# Patient Record
Sex: Male | Born: 1955 | Race: White | Hispanic: No | Marital: Single | Smoking: Never smoker
Health system: Southern US, Community
[De-identification: ages and names within clinical notes are randomized; demographics above are authoritative.]

## PROBLEM LIST (undated history)

## (undated) DIAGNOSIS — R6250 Unspecified lack of expected normal physiological development in childhood: Secondary | ICD-10-CM

## (undated) DIAGNOSIS — F419 Anxiety disorder, unspecified: Secondary | ICD-10-CM

## (undated) DIAGNOSIS — C73 Malignant neoplasm of thyroid gland: Secondary | ICD-10-CM

## (undated) HISTORY — DX: Anxiety disorder, unspecified: F41.9

## (undated) HISTORY — PX: OTHER SURGICAL HISTORY: SHX169

## (undated) HISTORY — DX: Malignant neoplasm of thyroid gland: C73

## (undated) HISTORY — PX: THYROIDECTOMY: SHX17

---
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
## 2010-03-30 ENCOUNTER — Ambulatory Visit: Payer: Self-pay | Admitting: Gastroenterology

## 2014-02-01 ENCOUNTER — Ambulatory Visit: Payer: Self-pay | Admitting: Otolaryngology

## 2014-02-08 DIAGNOSIS — C73 Malignant neoplasm of thyroid gland: Secondary | ICD-10-CM

## 2014-02-08 HISTORY — DX: Malignant neoplasm of thyroid gland: C73

## 2014-02-13 ENCOUNTER — Ambulatory Visit: Payer: Self-pay | Admitting: Otolaryngology

## 2014-03-18 ENCOUNTER — Ambulatory Visit: Payer: Self-pay | Admitting: Otolaryngology

## 2014-03-28 ENCOUNTER — Ambulatory Visit: Payer: Self-pay | Admitting: Otolaryngology

## 2014-03-28 LAB — ALBUMIN: Albumin: 3.6 g/dL (ref 3.4–5.0)

## 2014-03-28 LAB — CALCIUM
Calcium, Total: 8.5 mg/dL (ref 8.5–10.1)
Calcium, Total: 8.5 mg/dL (ref 8.5–10.1)

## 2014-03-28 LAB — MAGNESIUM: MAGNESIUM: 2.1 mg/dL

## 2014-03-29 LAB — CALCIUM: Calcium, Total: 8.7 mg/dL (ref 8.5–10.1)

## 2014-04-01 LAB — PATHOLOGY REPORT

## 2014-10-07 DIAGNOSIS — Z85850 Personal history of malignant neoplasm of thyroid: Secondary | ICD-10-CM | POA: Insufficient documentation

## 2015-02-01 NOTE — Op Note (Signed)
PATIENT NAME:  Ivan Winters, Ivan Winters MR#:  [id number] DATE OF BIRTH:  03-17-1956  DATE OF PROCEDURE:  03/28/2014  PREOPERATIVE DIAGNOSIS: Right-sided thyroid nodule with suspicious pathology on fine-needle aspiration.   POSTOPERATIVE DIAGNOSIS: Right-sided thyroid nodule with suspicious pathology on fine-needle aspiration.   PROCEDURE PERFORMED: Minimally invasive total thyroidectomy with laryngeal nerve monitoring.   ANESTHESIA: General endotracheal anesthesia.   ESTIMATED BLOOD LOSS: Less than 5 mL.   IV FLUIDS: Please see anesthesia record.   COMPLICATIONS: None.   DRAINS/STENT PLACEMENTS: None.   SPECIMENS: Total thyroid with a marking stitch in the right superior lobe.   SURGEON: Carloyn Manner, MD  ASSISTANT: Beverly Gust, MD   INDICATIONS FOR PROCEDURE: The patient is a 59 year old gentleman with history of neck pain, found to have fullness in the area of his right thyroid. 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   OPERATIVE FINDINGS: Total thyroidectomy performed. Bilateral recurrent laryngeal nerves were identified and robustly stimulated throughout the duration of the case. Bilateral superior and inferior parathyroid glands were identified and preserved.   DESCRIPTION OF PROCEDURE: After the patient was identified in holding, benefits and risks of the procedure were discussed and consent was reviewed, the patient was taken to the operating room and placed in the supine position. General endotracheal anesthesia with laryngeal nerve monitoring electrode on the endotracheal tube was placed. Visualization of proper placement was made by myself. At this time, the patient was prepped and draped in sterile fashion after injection of 7 mL of 0.25% Marcaine with 1:200,000 epinephrine. A horizontal skin incision was made between the strap muscles along the previously marked skin crease. Dissection was carried down through to the level  of strap muscles using Bovie electrocautery. Strap muscles were divided vertically along the median raphe from the thyroid notch down to the sternal notch, and attention was directed to patient's right hemi-thyroid.   The sternohyoid and sternothyroid muscles were bluntly and sharply dissected away from the right hemi-thyroid laterally to the carotid artery and superior to the superior thyroid vessels. A cleft and Joel's space between the larynx and the superior thyroid gland was bluntly dissected. This pedicled the superior pole and this was ligated using a Harmonic scalpel. The thyroid, at this point, was delivered out of the wound. This pedicled Berry's ligament. Blunt dissection in an area of the recurrent laryngeal nerve revealed superior parathyroid gland just anterior to the nerve. This was bluntly dissected and protected. The recurrent laryngeal nerve was identified and robustly stimulated. The inferior thyroid artery, at this time, was ligated with a Harmonic scalpel. The inferior parathyroid gland was identified and bluntly as well as sharply with a microbipolar forceps, dissected away from the inferior thyroid lobe. The remaining attachments of the right hemi-thyroid to the trachea were dissected using a Harmonic scalpel.   Attention, at this time, was directed to the patient's left hemi-thyroid. In a similar fashion, the sternothyroid and sternohyoid muscles were bluntly and sharply dissected away from the left hemi-thyroid until the carotid artery was encountered. Dissection was superior until the superior thyroid vessels. Joel's space was entered between the larynx and the left superior thyroid lobe. The left superior thyroid vessels were pedicled. A hemostat was placed along the superior thyroid lobe and then the superior thyroid vessels were ligated using a Harmonic scalpel. The thyroid, at this time, was retracted medially and dissection in the tracheoesophageal groove was made on the left side.  This demonstrated superior parathyroid gland and just  adjacent to that the recurrent laryngeal nerve. Dissection was more inferiorly and again fatty area of parathyroid gland in it was identified and this was protected. The recurrent laryngeal nerve was traced up until its insertion into the patient's larynx as well as inferiorly and then Berry's ligament was ligated using a combination of bipolar and Harmonic scalpel. Remaining attachments of the total thyroid were separated from the anterior tracheal wall. The specimen was marked in the right superior pole. The wound was copiously irrigated with sterile saline. Meticulous hemostasis was ensured. Surgicel was placed along the thyroid wound bed against the recurrent laryngeal nerve after they were robustly stimulated at the completion of the case and then the strap muscles were closed with a single figure-of-eight stitch of Vicryl and then the skin was closed in an interrupted fashion with Vicryl subcutaneous and Dermabond cutaneously and this was topped with a Steri-Strip. Care of the patient, at this time, was transferred to anesthesia.   ____________________________ Jerene Bears, MD ccv:aw D: 03/28/2014 08:54:26 ET T: 03/28/2014 09:08:30 ET JOB#: [id number]  cc: Jerene Bears, MD, <Dictator> Jerene Bears MD ELECTRONICALLY SIGNED 04/03/2014 8:27

## 2015-12-12 ENCOUNTER — Ambulatory Visit: Payer: Self-pay | Admitting: Physician Assistant

## 2015-12-12 ENCOUNTER — Encounter: Payer: Self-pay | Admitting: Physician Assistant

## 2015-12-12 VITALS — BP 119/65 | HR 91 | Temp 97.6°F

## 2015-12-12 DIAGNOSIS — J0100 Acute maxillary sinusitis, unspecified: Secondary | ICD-10-CM

## 2015-12-12 NOTE — Progress Notes (Signed)
   Subjective:    Patient ID: Ivan Winters, male    DOB: 08-24-1956, 60 y.o.   MRN: [id number]  HPI Patient c/o sinus and ear pressure for more than one week. Intermitting fever. Post nasal drainage. Mild cough. Denies N/V/D No palliative measure for compliant.   Review of Systems    Hyperthyroidism Objective:   Physical Exam HEENT for bilateral maxillary sinus guarding, edematous bilateral nasal turbinates.  Thic nasal discharge. Post nasal drainage.  Neck supple without adenopathy. Lungs CTA, and Heart RRR.       Assessment & Plan: Maxillary sinusitis   Amoxil and Allergra -D.  Follow up PCP.

## 2016-01-19 ENCOUNTER — Ambulatory Visit: Payer: Self-pay | Admitting: Physician Assistant

## 2016-01-19 ENCOUNTER — Encounter: Payer: Self-pay | Admitting: Physician Assistant

## 2016-01-19 VITALS — BP 110/69 | HR 85 | Temp 98.0°F

## 2016-01-19 DIAGNOSIS — J029 Acute pharyngitis, unspecified: Secondary | ICD-10-CM

## 2016-01-19 MED ORDER — AZITHROMYCIN 250 MG PO TABS: ORAL_TABLET | ORAL | Status: AC

## 2016-01-19 NOTE — Progress Notes (Signed)
S: C/o runny nose and congestion for 3 days, sore throat for 5d ;  no fever, chills, cp/sob, v/d; mucus is green and thick, cough is sporadic, c/o of facial and dental pain.   Using otc meds:   O: PE: vitals wnl, nad, perrl eomi, normocephalic, tms dull, nasal mucosa red and swollen, throat red, neck supple no lymph, lungs c t a, cv rrr, neuro intact  A:  Acute pharyngitis    P: zpack, drink fluids, continue regular meds , use otc meds of choice, return if not improving in 5 days, return earlier if worsening

## 2016-03-12 ENCOUNTER — Ambulatory Visit: Payer: Self-pay | Admitting: Physician Assistant

## 2016-03-12 ENCOUNTER — Encounter: Payer: Self-pay | Admitting: Physician Assistant

## 2016-03-12 VITALS — BP 120/80 | HR 76 | Temp 98.2°F

## 2016-03-12 DIAGNOSIS — H6123 Impacted cerumen, bilateral: Secondary | ICD-10-CM

## 2016-03-12 DIAGNOSIS — H6990 Unspecified Eustachian tube disorder, unspecified ear: Secondary | ICD-10-CM

## 2016-03-12 DIAGNOSIS — H60393 Other infective otitis externa, bilateral: Secondary | ICD-10-CM

## 2016-03-12 DIAGNOSIS — H6980 Other specified disorders of Eustachian tube, unspecified ear: Secondary | ICD-10-CM

## 2016-03-12 MED ORDER — FEXOFENADINE HCL 180 MG PO TABS: 180.0000 mg | ORAL_TABLET | Freq: Every day | ORAL | Status: DC

## 2016-03-12 MED ORDER — NEOMYCIN-POLYMYXIN-HC 3.5-10000-1 OT SOLN: 3.0000 [drp] | Freq: Three times a day (TID) | OTIC | Status: DC

## 2016-03-12 MED ORDER — PREDNISONE 10 MG PO TABS: 30.0000 mg | ORAL_TABLET | Freq: Every day | ORAL | Status: DC

## 2016-03-12 NOTE — Progress Notes (Signed)
S:  C/o ears popping and being stopped up, no drainage from ears, no fever/chills, no cough or congestion, some sore throat, remainder ros neg Using otc meds without relief  O:  Vitals wnl, nad,  A lot of cerumen in ear canals, tms dull white b/l,  throat wnl, neck supple no lymph, lungs c t a, cv rrr, neuro intact, irrigated ears b/l, cerumen removed, tms intact  A: acute eustachean tube dysfunction, cerumen removal  P: allegra, prednisone 30mg  qd x 3d, cortisporin otic drops, return if not improving in 3 to 5 days, return earlier if worsening

## 2016-05-12 DIAGNOSIS — F411 Generalized anxiety disorder: Secondary | ICD-10-CM | POA: Insufficient documentation

## 2016-05-12 DIAGNOSIS — H8110 Benign paroxysmal vertigo, unspecified ear: Secondary | ICD-10-CM | POA: Insufficient documentation

## 2016-06-04 ENCOUNTER — Ambulatory Visit: Payer: BLUE CROSS/BLUE SHIELD | Attending: Neurology | Admitting: Physical Therapy

## 2016-06-11 ENCOUNTER — Ambulatory Visit: Payer: BLUE CROSS/BLUE SHIELD | Admitting: Physical Therapy

## 2016-06-18 ENCOUNTER — Ambulatory Visit: Payer: BLUE CROSS/BLUE SHIELD | Attending: Neurology | Admitting: Physical Therapy

## 2016-06-18 DIAGNOSIS — R42 Dizziness and giddiness: Secondary | ICD-10-CM | POA: Diagnosis not present

## 2016-06-18 NOTE — Therapy (Signed)
[place] MAIN [facility] SERVICES [address] [place], [place], [address] Phone: [phone]   Fax:  [phone]  Physical Therapy Evaluation  Patient Details  Name: Ivan Winters MRN: [id number] Date of Birth: 05/30/1956 Referring Provider: Dr. Melrose Nakayama  Encounter Date: 06/18/2016      PT End of Session - 06/21/16 1539    Visit Number 1   Number of Visits 9   Date for PT Re-Evaluation 08/13/16   PT Start Time [id number]   PT Stop Time 1500   PT Time Calculation (min) 65 min   Equipment Utilized During Treatment Gait belt   Activity Tolerance Patient tolerated treatment well   Behavior During Therapy [facility] for tasks assessed/performed      Past Medical History:  Diagnosis Date  . Anxiety   . Malignant neoplasm of thyroid gland ([place]) 02/2014   Per neurology MD MR notes    Past Surgical History:  Procedure Laterality Date  . [place]   per neurology MD MR notes  . pins wrist-left Left   . THYROIDECTOMY      There were no vitals filed for this visit.       Subjective Assessment - 06/21/16 1615    Subjective Patient states that some of the tests are making him dizzy during the evlauation and reports vertigo with Dix-Hallpike testing this date.    Patient is accompained by: Family member  sister, Ivan Winters   Pertinent History There is no mention in medical record of learning difficulties however patient is unable to read and part of history provided by his sister, Ivan Winters as patient has difficulty expressing himself at times. In addition, patient has difficulty with numerical rating systems and patient's sister reports that patient has a difficult time with numbers. Patient reports he started having dizziness about 2 years ago. Patient reports vertigo symptoms and states he gets vertigo every day when he goes to lie down at night. Pt's sister Ivan Winters reports that patient had VNG testing performed at ENT physician office, but she is  not sure of test results.  Description of dizziness: vertigo, unsteadiness, lightheadedness, falling, general unsteadiness, aural fullness, nausea. Pt reports he gets headaches sometimes when he wakes up and reports he has usually 1-2 headaches a week. Frequency: every night when he goes to lie down and when he rolls over in bed. Duration: lasts about 1 minute; Symptoms nature: motion provoked/positional, intermittent; Provocatibe Factors: lying down, bending over and rolling over in bed, quick turns; Easing factors: staying still; History of similar episodes: none.     Diagnostic tests VNG tests per sister report, unknown results   Patient Stated Goals to have decreased dizziness and vertigo symptoms especially when he goes to lie down at night.    Currently in Pain? No/denies     VESTIBULAR AND BALANCE EVALUATION  Onset Date: 2 years ago  HISTORY:  Subjective history of current problem: There is no mention in medical record of learning difficulties however patient is unable to read and part of history provided by his sister, Ivan Winters as patient has difficulty expressing himself at times. In addition, patient has difficulty with numerical rating systems and patient's sister reports that patient has a difficult time with numbers. Patient reports he started having dizziness about 2 years ago. Patient reports vertigo symptoms and states he gets vertigo every day when he goes to lie down at night. Pt's sister Ivan Winters reports that patient had VNG testing performed at ENT physician  office, but she is not sure of test results.  Description of dizziness: vertigo, unsteadiness, lightheadedness, falling, general unsteadiness, aural fullness, nausea. Pt reports he gets headaches sometimes when he wakes up and reports he has usually 1-2 headaches a week.  Frequency: every night when he goes to lie down and when he rolls over in bed  Duration: lasts about a minute Symptom nature: motion provoked/positional,  intermittent  Provocative Factors: lying down, bending over and rolling over bed, quick turns Easing Factors: staying still   History of similar episodes: none  Falls (yes/no): no Number of falls in past 6 months: none  Prior Functional Level: Pt works in Nurse, adult work currently. Patient walks around town for recreation and exercise.   Auditory complaints (tinnitus, pain, drainage): pt reports ringing in left ear for about 1 year. Denies pain and drainage Vision (last eye exam, diplopia, recent changes): "crystal lights flashing" in his eyes which he reports has been going on for years. No recent ages.   Current Symptoms: Review of systems negative for red flags.   EXAMINATION  POSTURE:  Forward head, rounded shoulders; slumped seated posture with sacral sitting       COORDINATION: Finger to Nose:   Normal Past Pointing:   Normal   MUSCULOSKELETAL SCREEN: Cervical Spine ROM: Forward head posture, pt with cervical AROM R/L rotation WFL and normal flexion/extension. Pt reports mild discomfort along front sides of his neck along the platsyma muscle with neck movements.   Functional Mobility: Independent with bed mobility and transfers  Gait: Patient ambulates without AD. Pt with step through gait pattern and able to ambulate up/down stairs using bilateral rails. Pt does demonstrate change in gait speed with attempts to step over an object placed on floor. Scanning of visual environment with gait is: fair  Balance: Pt demonstrates mild imbalance with EC on foam with feet together. Pt demonstrates mild difficulty with SLS activites. Pt does well with ambulation with heaad turns and body turns this date.   POSTURAL CONTROL TESTS:  Clinical Test of Sensory Interaction for Balance    (CTSIB):  CONDITION TIME STRATEGY SWAY  Eyes open, firm surface 30 seconds ankle   Eyes closed, firm surface 30 seconds ankle +1  Eyes open, foam surface 30 seconds ankle +1  Eyes closed, foam  surface 30 seconds ankle +2    OCULOMOTOR / VESTIBULAR TESTING:  Oculomotor Exam- Room Light  Normal Abnormal Comments  Ocular Alignment N    Ocular ROM N    Spontaneous Nystagmus N    End-Gaze Nystagmus  A Horizontal nystagmus at end gaze with left gaze  Smooth Pursuit  A Large saccades noted   Saccades N  accurate  VOR  A Recreates dizziness and blurring  VOR Cancellation  A Causes dizziness  Left Head Thrust   Deferred secondary to positive Dix-Hallpike test this date  Right Head Thrust   Deferred secondary to positive Dix-Hallpike test this date  Head Shaking Nystagmus   Deferred secondary to positive Dix-Hallpike test this date     BPPV TESTS:  Symptoms Duration Intensity Nystagmus  L Dix-Hallpike vertigo Less than one minute Unable to rate on 0-10 scale; pt reports left symptoms worse than right symptoms None observed in room light and with Frenzl lenses donned  R Dix-Hallpike vertigo Less than one minute Unable to rate on 0-10 scale None observed    FUNCTIONAL OUTCOME MEASURES:  Results Comments  DHI 28 Low perception of handicap  ABC Scale 66.2% Falls risk; in  need of intervention  DGI 22/24 Safe for community mobility           [facility] PT Assessment - 06/21/16 0001      Assessment   Medical Diagnosis bilateral BPPV   Referring Provider Dr. Melrose Nakayama   Onset Date/Surgical Date --  2 years ago   Prior Therapy none     Precautions   Precautions None     Restrictions   Weight Bearing Restrictions No     Balance Screen   Has the patient fallen in the past 6 months No   Has the patient had a decrease in activity level because of a fear of falling?  No   Is the patient reluctant to leave their home because of a fear of falling?  No     Home Ecologist residence   Living Arrangements Parent   Available Help at Discharge Family;Friend(s)   Type of Home House   Home Access Level entry   [place] bars - tub/shower     Prior Function   Level of Independence Independent     Balance   Balance Assessed Yes     Standardized Balance Assessment   Standardized Balance Assessment Dynamic Gait Index     Dynamic Gait Index   Level Surface Normal   Change in Gait Speed Normal   Gait with Horizontal Head Turns Normal   Gait with Vertical Head Turns Normal   Gait and Pivot Turn Normal   Step Over Obstacle Mild Impairment   Step Around Obstacles Normal   Steps Mild Impairment   Total Score 22      Canalith Repositioning: Performed Left and Right Dix-Hallpike test and both were potentially positive with no nystagmus observed however patient reports vertigo and this recreated patient's subjective symptoms of dizziness.  Pt has difficult time rating vertigo on 0-10 scale.  Pt does however report that left ear was worse than the right ear and therefore treated left ear.  Performed left Dix-Hallpike test and it was potentially positive no nystagmus observed in room light and with Frenzl lenses donned; however, pt reports that this recreates his vertigo symptoms.  Performed canalith repositioning maneuver (CRT); Repeated L CRT for a total of 2 maneuvers with retesting between maneuvers. Pt reported improvement in vertigo symptoms with second CRT but patient not able to rate on 0-10 scale.       PT Education - 06/21/16 1538    Education provided Yes   Education Details Discussed POC and goals; discussed BPPV and demonstrated Dix-Hallpike test   Person(s) Educated Patient;Other (comment)  sister, Ivan Winters   Methods Explanation;Demonstration   Comprehension Verbal cues required             PT Long Term Goals - 06/21/16 1543      PT LONG TERM GOAL #1   Title Patient will report 50% or greater improvement in his symptoms of dizziness and imbalance with provoking motions or positions by 08/13/2016.   Time 8   Period Weeks   Status New     PT LONG TERM GOAL #2   Title Patient  reports no vertigo with lying down and rolling over in bed by 08/13/2016.   Time 8   Period Weeks   Status New     PT LONG TERM GOAL #3   Title Patient will have demonstrate decreased falls risk as indicated by Activities Specific Balance Confidence Scale score of  80% or greater by 08/13/2016.   Baseline 66% on 9/8;    Time 8   Period Weeks   Status New     PT LONG TERM GOAL #4   Time --   Period --   Status --               Plan - 06/21/16 1540    Clinical Impression Statement Patient presents with potentially positive bilateral Dix-Hallpike tests as test position recreates patient's symptoms of vertigo which last less than one minute; however, no nystagmus observed. Patient tolerated canalith repositioning manuever well and reports mild improvement with repeat maneuvers this date. Patient would benefit from follow-up to re-test Dix-Hallpike testing and repeat CRT if indicated.  Patient was also noted to have horizontal nysatgmus with end gaze left and several large corrective saccades with smooth pursuit testing which is could be a potential central sign. Patient would benefit from PT services to try to address subjective symtpoms of dizziness and to decrease falls risk and to improve his balance.     Rehab Potential Good   Clinical Impairments Affecting Rehab Potential Positive Indicators: family support   Negative Indicators: chronicity   PT Frequency 1x / week   PT Duration 8 weeks   PT Treatment/Interventions Canalith Repostioning;Neuromuscular re-education;Balance training;Vestibular;Patient/family education;Stair training;Gait training;Therapeutic activities;Therapeutic exercise   PT Next Visit Plan repeat Dix-Hallpike testing and repeat CRT if indicated   Consulted and Agree with Plan of Care Patient;Family member/caregiver   Family Member Consulted sister, Ivan Winters      Patient will benefit from skilled therapeutic intervention in order to improve the following deficits  and impairments:  Dizziness, Decreased balance  Visit Diagnosis: Dizziness and giddiness     Problem List There are no active problems to display for this patient.  Lady Deutscher PT, DPT Lady Deutscher 06/21/2016, 4:23 PM  [place] MAIN [facility] SERVICES [address] [place], [place], [address] Phone: [phone]   Fax:  [phone]  Name: Ivan Winters MRN: [id number] Date of Birth: August 07, 1956

## 2016-06-21 ENCOUNTER — Encounter: Payer: Self-pay | Admitting: Physical Therapy

## 2016-06-25 ENCOUNTER — Ambulatory Visit: Payer: BLUE CROSS/BLUE SHIELD | Admitting: Physical Therapy

## 2016-06-25 ENCOUNTER — Encounter: Payer: Self-pay | Admitting: Physical Therapy

## 2016-06-25 DIAGNOSIS — R42 Dizziness and giddiness: Secondary | ICD-10-CM

## 2016-06-25 NOTE — Therapy (Signed)
[place] MAIN [facility] SERVICES [address] [place], [place], [address] Phone: [phone]   Fax:  [phone]  Physical Therapy Treatment  Patient Details  Name: Ivan Winters MRN: [id number] Date of Birth: 12/26/1955 Referring Provider: Dr. Melrose Nakayama  Encounter Date: 06/25/2016      PT End of Session - 06/25/16 0857    Visit Number 2   Number of Visits 9   Date for PT Re-Evaluation 08/13/16   PT Start Time 0818   PT Stop Time [id number]   PT Time Calculation (min) 39 min   Equipment Utilized During Treatment Gait belt   Activity Tolerance Patient tolerated treatment well   Behavior During Therapy [facility] for tasks assessed/performed      Past Medical History:  Diagnosis Date  . Anxiety   . 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 [place]   per neurology MD MR notes  . pins wrist-left Left   . THYROIDECTOMY      There were no vitals filed for this visit.      Subjective Assessment - 06/25/16 0856    Subjective Patient states he gets headaches sometimes when he gets dizzy. Pt describes seeing crystals in his eyes prior to headache onset. Patient states that bending over and lying down are continuing to bring on his dizziness.    Patient is accompained by: Family member  sister, Ivan Winters   Pertinent History There is no mention in medical record of learning difficulties however patient is unable to read and part of history provided by his sister, Ivan Winters as patient has difficulty expressing himself at times. In addition, patient has difficulty with numerical rating systems and patient's sister reports that patient has a difficult time with numbers. Patient reports he started having dizziness about 2 years ago. Patient reports vertigo symptoms and states he gets vertigo every day when he goes to lie down at night. Pt's sister Ivan Winters  reports that patient had VNG testing performed at ENT physician office, but she is not sure of test results.  Description of dizziness: vertigo, unsteadiness, lightheadedness, falling, general unsteadiness, aural fullness, nausea. Pt reports he gets headaches sometimes when he wakes up and reports he has usually 1-2 headaches a week. Frequency: every night when he goes to lie down and when he rolls over in bed. Duration: lasts about 1 minute; Symptoms nature: motion provoked/positional, intermittent; Provocatibe Factors: lying down, bending over and rolling over in bed, quick turns; Easing factors: staying still; History of similar episodes: none.     Diagnostic tests VNG tests per sister report, unknown results   Patient Stated Goals to have decreased dizziness and vertigo symptoms especially when he goes to lie down at night.    Currently in Pain? No/denies     Canalith Repositioning: Performed Left Dix-Hallpike test it was potentially positive with what appeared to be several beats of nystagmus but it was difficult to determine if it was upbeating or downbeating did observe rotary component and patient reported reproduction of his vertigo symptoms.   Performed left canalith repositioning maneuver (CRT) Repeated L CRT for a total of 3 maneuvers with retesting between maneuvers. Patient is not able to rate items on the 0-10 scale but patient did report decreasing vertigo with each successive CRT and did not observe any nystagmus with repeat Dix-Hallpike.    After canalith repositioning maneuver,  performed neuromuscular re-education.   Neuromuscular Re-education:  Airex pad: On firm surface and then on Airex pad, performed feet together and semi-tandem progressions with alternate lead leg with and without horiz and vert head turns with CGA. Issued exercises for HEP and discussed safety precautions. Reviewed this again with patient's sister, Ivan Winters.       PT Education - 06/25/16 0857    Education  provided Yes   Education Details Issued HEP   Person(s) Educated Patient;Other (comment)  sister Wanda-reviewed HEP   Methods Explanation;Demonstration;Handout;Verbal cues   Comprehension Returned demonstration;Verbal cues required             PT Long Term Goals - 06/21/16 1543      PT LONG TERM GOAL #1   Title Patient will report 50% or greater improvement in his symptoms of dizziness and imbalance with provoking motions or positions by 08/13/2016.   Time 8   Period Weeks   Status New     PT LONG TERM GOAL #2   Title Patient reports no vertigo with lying down and rolling over in bed by 08/13/2016.   Time 8   Period Weeks   Status New     PT LONG TERM GOAL #3   Title Patient will have demonstrate decreased falls risk as indicated by Activities Specific Balance Confidence Scale score of 80% or greater by 08/13/2016.   Baseline 66% on 9/8;    Time 8   Period Weeks   Status New     PT LONG TERM GOAL #4   Time --   Period --   Status --               Plan - 06/25/16 0858    Clinical Impression Statement Patient continues with positive left Dix-Hallpike test this date and performed 3 canalith repositioning maneuvers. Pt reports decrease in vertigo with each successive CRT treatment this date. Pt unable to rate vertigo on 0-10 scale secondary to cognition. Pt challenged by balance activties this date involving narrow BOS and uneven surfaces. Encouraged patient to try to perform HEP and discussed safety precautions wtih patient and his sister.    Rehab Potential Good   Clinical Impairments Affecting Rehab Potential Positive Indicators: family support   Negative Indicators: chronicity   PT Frequency 1x / week   PT Duration 8 weeks   PT Treatment/Interventions Canalith Repostioning;Neuromuscular re-education;Balance training;Vestibular;Patient/family education;Stair training;Gait training;Therapeutic activities;Therapeutic exercise   PT Next Visit Plan repeat Dix-Hallpike  testing and repeat CRT if indicated; review HEP   PT Home Exercise Plan feet together and semi-tandem progresssions wtih and    Consulted and Agree with Plan of Care Patient;Family member/caregiver   Family Member Consulted sister, Ivan Winters      Patient will benefit from skilled therapeutic intervention in order to improve the following deficits and impairments:  Dizziness, Decreased balance  Visit Diagnosis: Dizziness and giddiness     Problem List There are no active problems to display for this patient.   Shoshana Johal 06/25/2016, 3:42 PM  [place] MAIN [facility] SERVICES [address] [place], [place], [address] Phone: [phone]   Fax:  [phone]  Name: CALVYN PORTERA MRN: [id number] Date of Birth: 03-29-1956

## 2016-06-28 ENCOUNTER — Ambulatory Visit: Payer: Self-pay | Admitting: Physical Therapy

## 2016-07-02 ENCOUNTER — Encounter: Payer: Self-pay | Admitting: Physical Therapy

## 2016-07-02 ENCOUNTER — Ambulatory Visit: Payer: BLUE CROSS/BLUE SHIELD | Admitting: Physical Therapy

## 2016-07-02 DIAGNOSIS — R42 Dizziness and giddiness: Secondary | ICD-10-CM

## 2016-07-02 NOTE — Therapy (Signed)
[place] MAIN [facility] SERVICES [address] [place], [place], [address] Phone: [phone]   Fax:  [phone]  Physical Therapy Treatment  Patient Details  Name: Ivan Winters MRN: [id number] Date of Birth: 1956-01-08 Referring Provider: Dr. Melrose Nakayama  Encounter Date: 07/02/2016      PT End of Session - 07/02/16 1032    Visit Number 3   Number of Visits 9   Date for PT Re-Evaluation 08/13/16   PT Start Time 0820   PT Stop Time 0902   PT Time Calculation (min) 42 min   Equipment Utilized During Treatment Gait belt   Activity Tolerance Patient tolerated treatment well   Behavior During Therapy [facility] for tasks assessed/performed      Past Medical History:  Diagnosis Date  . Anxiety   . 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 [place]   per neurology MD MR notes  . pins wrist-left Left   . THYROIDECTOMY      There were no vitals filed for this visit.      Subjective Assessment - 07/02/16 0821    Subjective Patient states when he woke up this morning he felt dizzy on the right side. Patient states he tried his exercises but states he was not doing the exercises every day. (Reinforced importance of doing exercises every day)   Patient is accompained by: Family member  sister, Ivan Winters   Pertinent History There is no mention in medical record of learning difficulties however patient is unable to read and part of history provided by his sister, Ivan Winters as patient has difficulty expressing himself at times. In addition, patient has difficulty with numerical rating systems and patient's sister reports that patient has a difficult time with numbers. Patient reports he started having dizziness about 2 years ago. Patient reports vertigo symptoms and states he gets vertigo every day when he goes to lie down at night. Pt's sister Ivan Winters  reports that patient had VNG testing performed at ENT physician office, but she is not sure of test results.  Description of dizziness: vertigo, unsteadiness, lightheadedness, falling, general unsteadiness, aural fullness, nausea. Pt reports he gets headaches sometimes when he wakes up and reports he has usually 1-2 headaches a week. Frequency: every night when he goes to lie down and when he rolls over in bed. Duration: lasts about 1 minute; Symptoms nature: motion provoked/positional, intermittent; Provocatibe Factors: lying down, bending over and rolling over in bed, quick turns; Easing factors: staying still; History of similar episodes: none.     Diagnostic tests VNG tests per sister report, unknown results   Patient Stated Goals to have decreased dizziness and vertigo symptoms especially when he goes to lie down at night.    Currently in Pain? Other (Comment)  none stated     Canalith Repositioning: Performed Left Dix-Hallpike test and it was positive with nystagmus of short duration observed briefly secondary to patient squeezing his eyes shut therefore difficult to see if nystagmus it appeared to be up-beating but unable to tell  R or L torsional in direction. Patient reported "large" amount of vertigo as patient unable to use 0-10 scale to rate secondary to cognition. Vertigo lasted less than one minute. Performed canalith repositioning maneuver (CRT) Repeated L CRT for a total of 2 maneuvers with retesting between maneuvers. Upon the second L Dix-Hallpike with subsequent CRT  patient denied any dizziness throughout.  Performed right Dix-Hallpike test and it was potentially positive with no nystagmus observed however patient reports reproduction of his dizziness symptoms that lasted about 1-2 minutes. Patient reported "small" amount of vertigo. Performed canalith repositioning maneuver (CRT) Repeated R CRT for a total of 2 maneuvers with retesting between maneuvers.    NOTE: After canalith  repositioning maneuver, performed neuromuscular re-education.   Neuromuscular Re-education: Patient demonstrated HEP. Reinforced safety and provided vc for speed of head turns.  Airex pad: On Airex pad, patient performed feet together and semi-tandem progressions with alternating lead leg with and without body turns and horiz and vert head turns. Patient reports mild increase in dizziness with horiz and vert head turns.   Walking with head turns: Performed multiple 40' trials each of forwards ambulation with vert and horiz head turns with CGA. Patient demonstrates mild veering with amb with both vert and horiz head turns and demonstrated change in cadence with amb with horiz head turns.         PT Education - 07/02/16 1031    Education provided Yes   Education Details HEP progressions and reviewed HEP; discussed BPPV    Person(s) Educated Patient;Other (comment)  sister, Ivan Winters   Methods Explanation;Demonstration;Verbal cues   Comprehension Verbalized understanding;Returned demonstration;Verbal cues required             PT Long Term Goals - 06/21/16 1543      PT LONG TERM GOAL #1   Title Patient will report 50% or greater improvement in his symptoms of dizziness and imbalance with provoking motions or positions by 08/13/2016.   Time 8   Period Weeks   Status New     PT LONG TERM GOAL #2   Title Patient reports no vertigo with lying down and rolling over in bed by 08/13/2016.   Time 8   Period Weeks   Status New     PT LONG TERM GOAL #3   Title Patient will have demonstrate decreased falls risk as indicated by Activities Specific Balance Confidence Scale score of 80% or greater by 08/13/2016.   Baseline 66% on 9/8;    Time 8   Period Weeks   Status New     PT LONG TERM GOAL #4   Time --   Period --   Status --               Plan - 07/02/16 1032    Clinical Impression Statement Patient with positive left Dix-Hallpike test with nystagmus observed and patient  reporting "large" amount of vertigo. Pt unable to rate vertigo on 0-10 scale secondary to cognition. Patient with potentially positive right Dix-Hallpike test as this reproduced his symptoms but no nystagmus observed. Performed Epley maneuvers 2 each side. Patient reports no dizziness with second Epley on teh left side. Patient able to demonstrate his HEP with min vc and worked on progressions this date. Patient reports actvities with horiz and vert head turns reproduce his symptoms but states the horiz is the most difficult. encouraged patient to follow-up as indicated and to perform HEP daily.    Rehab Potential Good   Clinical Impairments Affecting Rehab Potential Positive Indicators: family support   Negative Indicators: chronicity   PT Frequency 1x / week   PT Duration 8 weeks   PT Treatment/Interventions Canalith Repostioning;Neuromuscular re-education;Balance training;Vestibular;Patient/family education;Stair training;Gait training;Therapeutic activities;Therapeutic exercise   PT Next Visit Plan repeat Dix-Hallpike testing and repeat CRT if indicated; review HEP; work on ambulation with  head turns retro and fwd, ball toss to self and with amb, VOR when [facility] is negative   PT Home Exercise Plan feet together and semi-tandem progresssions with horiz and vert head turns and body turns; ambulation with head turns horiz and vert   Consulted and Agree with Plan of Care Patient;Family member/caregiver   Family Member Consulted sister, Ivan Winters      Patient will benefit from skilled therapeutic intervention in order to improve the following deficits and impairments:  Dizziness, Decreased balance  Visit Diagnosis: Dizziness and giddiness     Problem List There are no active problems to display for this patient.  Lady Deutscher PT, DPT Lady Deutscher 07/02/2016, 10:54 AM  [place] MAIN [facility] SERVICES [address] [place], [place], [address] Phone:  [phone]   Fax:  [phone]  Name: TORRANCE HON MRN: [id number] Date of Birth: 09-05-1956

## 2016-07-09 ENCOUNTER — Encounter: Payer: Self-pay | Admitting: Physical Therapy

## 2016-07-09 ENCOUNTER — Ambulatory Visit: Payer: BLUE CROSS/BLUE SHIELD | Admitting: Physical Therapy

## 2016-07-09 DIAGNOSIS — R42 Dizziness and giddiness: Secondary | ICD-10-CM

## 2016-07-09 NOTE — Therapy (Signed)
[place] MAIN [facility] SERVICES [address] [place], [place], [address] Phone: [phone]   Fax:  [phone]  Physical Therapy Treatment  Patient Details  Name: Ivan Winters MRN: [id number] Date of Birth: 07/05/1956 Referring Provider: Dr. Melrose Nakayama  Encounter Date: 07/09/2016      PT End of Session - 07/09/16 1100    Visit Number 4   Number of Visits 9   Date for PT Re-Evaluation 08/13/16   PT Start Time 0823   PT Stop Time 0909   PT Time Calculation (min) 46 min   Activity Tolerance Patient tolerated treatment well   Behavior During Therapy [facility] for tasks assessed/performed      Past Medical History:  Diagnosis Date  . Anxiety   . 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 [place]   per neurology MD MR notes  . pins wrist-left Left   . THYROIDECTOMY      There were no vitals filed for this visit.      Subjective Assessment - 07/09/16 0827    Subjective Patient states the dizziness is better on the right but still having problems with the left ear. Pt states he had an episode of dizziness outside and states he nearly fell. Patient states the left side is  a little better but he is still having dizziness.    Patient is accompained by: Family member  sister, Ivan Winters   Pertinent History There is no mention in medical record of learning difficulties however patient is unable to read and part of history provided by his sister, Ivan Winters as patient has difficulty expressing himself at times. In addition, patient has difficulty with numerical rating systems and patient's sister reports that patient has a difficult time with numbers. Patient reports he started having dizziness about 2 years ago. Patient reports vertigo symptoms and states he gets vertigo every day when he goes to lie down at night. Pt's sister Ivan Winters reports that  patient had VNG testing performed at ENT physician office, but she is not sure of test results.  Description of dizziness: vertigo, unsteadiness, lightheadedness, falling, general unsteadiness, aural fullness, nausea. Pt reports he gets headaches sometimes when he wakes up and reports he has usually 1-2 headaches a week. Frequency: every night when he goes to lie down and when he rolls over in bed. Duration: lasts about 1 minute; Symptoms nature: motion provoked/positional, intermittent; Provocatibe Factors: lying down, bending over and rolling over in bed, quick turns; Easing factors: staying still; History of similar episodes: none.     Diagnostic tests VNG tests per sister report, unknown results   Patient Stated Goals to have decreased dizziness and vertigo symptoms especially when he goes to lie down at night.    Currently in Pain? No/denies      Canalith Repositioning: Performed Left Dix-Hallpike test and patient reports mild vertigo but difficult to observe nystagmus this date as patient was trying to look at therapist which was in his superior gaze. Appeared to have one or two upbeats with no torsional component observed during the first Dix-Hallpike test but did not observe any further nystagmus with repeat Dix-hallpike tests this date.  Performed canalith repositioning maneuver (CRT) Repeated L CRT for a total of 3 maneuvers  NOTE: After canalith repositioning, worked on Brewing technologist.   Neuromuscular Re-education: Discussed and demonstrated Brandt-Daroff exercise. Increased time  required. Attempted to have patient demonstrate back the Brandt-Daroff exercise and patient required modeling and verbal and tactile cuing. Repeated patient teach back several times. Patient's sister, Ivan Winters, present during session and she stated she would try to review how to perform this exercise with patient again at home. Patient is unable to read and was therefore provided with a hand-out that had  picture illustrations of the exercise as well as written description. Will plan at next session to have patient demonstrate this exercise again and will provide repeat instructions as needed.        PT Education - 07/09/16 1059    Education provided Yes   Education Details Issued Laruth Bouchard Daroff exercise and spent increased time demonstrating and then having patient try to return demonstration   Person(s) Educated Patient;Other (comment);Caregiver(s)  sister Ivan Winters   Methods Explanation;Demonstration;Handout;Verbal cues   Comprehension Need further instruction;Returned demonstration;Verbal cues required             PT Long Term Goals - 06/21/16 1543      PT LONG TERM GOAL #1   Title Patient will report 50% or greater improvement in his symptoms of dizziness and imbalance with provoking motions or positions by 08/13/2016.   Time 8   Period Weeks   Status New     PT LONG TERM GOAL #2   Title Patient reports no vertigo with lying down and rolling over in bed by 08/13/2016.   Time 8   Period Weeks   Status New     PT LONG TERM GOAL #3   Title Patient will have demonstrate decreased falls risk as indicated by Activities Specific Balance Confidence Scale score of 80% or greater by 08/13/2016.   Baseline 66% on 9/8;    Time 8   Period Weeks   Status New     PT LONG TERM GOAL #4   Time --   Period --   Status --               Plan - 07/09/16 1101    Clinical Impression Statement Patient with no nystagus observed with left Dix-Hallpike testing this date but patient reports "a little" amount of vertigo and therefore performed canalith repositioning maneuvers. Patient during last maneuver reports no vertigo. Patient reporting that he is performing HEP but states that he holds on. Reviewed with patient and his sister that patient should touch only as needed for balance with HEP. Will plan on reviewing HEP again next session.  Patient reporting that he is now only feeling  dizziness with the left ear now and is reporting improvement in his dizziness symptoms overall.  Patient unable to use 0-10 rating scale secondary to cognition.  Patient would benefit from continued PT services to try to address goals as set on POC, further reduce dizziness and imbalance symptoms and to decrease his falls risk.    Rehab Potential Good   Clinical Impairments Affecting Rehab Potential Positive Indicators: family support   Negative Indicators: chronicity   PT Frequency 1x / week   PT Duration 8 weeks   PT Treatment/Interventions Canalith Repostioning;Neuromuscular re-education;Balance training;Vestibular;Patient/family education;Stair training;Gait training;Therapeutic activities;Therapeutic exercise   PT Next Visit Plan repeat left Dix-Hallpike testing and repeat CRT if indicated; REVIEW HEP- BRANDT-DAROFF; if time allows, work on ambulation with head turns retro and fwd, ball toss to self and with amb, VOR when [facility] is negative   PT Home Exercise Plan feet together and semi-tandem progresssions with horiz and vert head turns and body  turns; ambulation with head turns horiz and vert; Brandt-Daroff exercises   Consulted and Agree with Plan of Care Patient;Family member/caregiver   Family Member Consulted sister, Ivan Winters      Patient will benefit from skilled therapeutic intervention in order to improve the following deficits and impairments:  Dizziness, Decreased balance  Visit Diagnosis: Dizziness and giddiness     Problem List There are no active problems to display for this patient.   Murphy,Dorriea 07/09/2016, 11:08 AM  [place] MAIN [facility] SERVICES [address] [place], [place], [address] Phone: [phone]   Fax:  [phone]  Name: Ivan Winters MRN: [id number] Date of Birth: 23-Nov-1955

## 2016-07-16 ENCOUNTER — Ambulatory Visit: Payer: BLUE CROSS/BLUE SHIELD | Attending: Neurology | Admitting: Physical Therapy

## 2016-07-16 DIAGNOSIS — R42 Dizziness and giddiness: Secondary | ICD-10-CM

## 2016-07-16 NOTE — Therapy (Signed)
[place] MAIN [facility] SERVICES [address] [place], [place], [address] Phone: [phone]   Fax:  [phone]  Physical Therapy Treatment  Patient Details  Name: Ivan Winters MRN: [id number] Date of Birth: 1955-10-27 Referring Provider: Dr. Melrose Nakayama  Encounter Date: 07/16/2016      PT End of Session - 07/16/16 1203    Visit Number 5   Number of Visits 9   Date for PT Re-Evaluation 08/13/16   PT Start Time 1118   PT Stop Time 1203   PT Time Calculation (min) 45 min   Activity Tolerance Patient tolerated treatment well   Behavior During Therapy [facility] for tasks assessed/performed      Past Medical History:  Diagnosis Date  . Anxiety   . 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 [place]   per neurology MD MR notes  . pins wrist-left Left   . THYROIDECTOMY      There were no vitals filed for this visit.      Subjective Assessment - 07/16/16 1123    Subjective Patient's sister states that patient had his follow up appointment with Dr. Melrose Nakayama last week and she states that Dr. Melrose Nakayama said that some of the patient's ear problems could be related to acid reflux. Dr. Melrose Nakayama prescribed Zantac which Grayland Ormond reports he has been taking daily. Patient reports his dizziness symptoms have been much better this past week (pt unable to rate using conventional scales such as percentages or 0-10 secondary due to cognition), but states he is still having a little dizziness when he lies down on the left. Patient states it only happens when he lies down on the left. Patient states he has been trying to do his exercises at home.      Patient is accompained by: Family member  sister, [name]   Pertinent History There is no mention in medical record of learning difficulties however patient is unable to read and part of history provided by his sister,  [name] as patient has difficulty expressing himself at times. In addition, patient has difficulty with numerical rating systems and patient's sister reports that patient has a difficult time with numbers. Patient reports he started having dizziness about 2 years ago. Patient reports vertigo symptoms and states he gets vertigo every day when he goes to lie down at night. Pt's sister [name] reports that patient had VNG testing performed at ENT physician office, but she is not sure of test results.  Description of dizziness: vertigo, unsteadiness, lightheadedness, falling, general unsteadiness, aural fullness, nausea. Pt reports he gets headaches sometimes when he wakes up and reports he has usually 1-2 headaches a week. Frequency: every night when he goes to lie down and when he rolls over in bed. Duration: lasts about 1 minute; Symptoms nature: motion provoked/positional, intermittent; Provocatibe Factors: lying down, bending over and rolling over in bed, quick turns; Easing factors: staying still; History of similar episodes: none.     Diagnostic tests VNG tests per sister report, unknown results   Patient Stated Goals to have decreased dizziness and vertigo symptoms especially when he goes to lie down at night.    Currently in Pain? No/denies      Neuromuscular Re-education:  Performed Left Dix-Hallpike test and it was negative with no nystagmus observed and patient denied vertigo.   Increased time spent reviewing Brandt-Daroff exercise  with patient and demonstrated what he has been doing at home. Patient required verbal and tactile cues for technique and practiced additional times. Patient was able to demonstrate correct form. Then, performed stair activity and afterwards patient asked to perform Brandt-Daroff exercise again and he was able to perform without any cuing demonstrating proper technique.   Performed ambulation up/down 1 flight of stairs and 5 steps with left rail with horiz head turns with  CGA. Patient had mentioned that he had some dizziness in the past with maneuvering on steps. Patient did report that performing stair climbing with horiz head turns did reproduce his symptoms and states he has mild dizziness on the left side of his head with this activity this date.       PT Education - 07/16/16 1345    Education provided Yes   Education Details Spent increased time reviewing Nestor Lewandowsky exercise.    Person(s) Educated Patient;Other (comment)  sister, [name]   Methods Explanation;Demonstration;Verbal cues   Comprehension Returned demonstration;Verbal cues required;Tactile cues required             PT Long Term Goals - 06/21/16 1543      PT LONG TERM GOAL #1   Title Patient will report 50% or greater improvement in his symptoms of dizziness and imbalance with provoking motions or positions by 08/13/2016.   Time 8   Period Weeks   Status New     PT LONG TERM GOAL #2   Title Patient reports no vertigo with lying down and rolling over in bed by 08/13/2016.   Time 8   Period Weeks   Status New     PT LONG TERM GOAL #3   Title Patient will have demonstrate decreased falls risk as indicated by Activities Specific Balance Confidence Scale score of 80% or greater by 08/13/2016.   Baseline 66% on 9/8;    Time 8   Period Weeks   Status New     PT LONG TERM GOAL #4   Time --   Period --   Status --               Plan - 07/16/16 1347    Clinical Impression Statement Patient improving with demonstration of how he has been doing [employer] exercises at home and required review and cuing for technique. Patient reporting significant improvement in his dizziness symptoms this past week but does state he is still getting mild dizziness with lying down on his left side. Patient encouraged to follow-up as indicated and to continue to perform HEP as discussed.     Rehab Potential Good   Clinical Impairments Affecting Rehab Potential Positive Indicators: family  support   Negative Indicators: chronicity   PT Frequency 1x / week   PT Duration 8 weeks   PT Treatment/Interventions Canalith Repostioning;Neuromuscular re-education;Balance training;Vestibular;Patient/family education;Stair training;Gait training;Therapeutic activities;Therapeutic exercise   PT Next Visit Plan Review Nestor Lewandowsky, work on ambulation with head turn activities, nose towards knee and/ or sitting with horiz head turn habituation exercise possibly    PT Home Exercise Plan feet together and semi-tandem progresssions with horiz and vert head turns and body turns; ambulation with head turns horiz and vert; Brandt-Daroff exercises   Consulted and Agree with Plan of Care Patient;Family member/caregiver   Family Member Consulted sister, [name]      Patient will benefit from skilled therapeutic intervention in order to improve the following deficits and impairments:  Dizziness, Decreased balance  Visit Diagnosis: Dizziness and giddiness  Problem List There are no active problems to display for this patient.   [place],Dorriea 07/16/2016, 3:09 PM  [place] MAIN [facility] SERVICES [address] [place], [place], [address] Phone: [phone]   Fax:  [phone]  Name: JOCOB ZALOUDEK MRN: [id number] Date of Birth: [birth year]

## 2016-07-23 ENCOUNTER — Encounter: Payer: Self-pay | Admitting: Physical Therapy

## 2016-07-23 ENCOUNTER — Ambulatory Visit: Payer: BLUE CROSS/BLUE SHIELD | Admitting: Physical Therapy

## 2016-07-23 DIAGNOSIS — R42 Dizziness and giddiness: Secondary | ICD-10-CM | POA: Diagnosis not present

## 2016-07-23 NOTE — Therapy (Signed)
[place] MAIN [facility] SERVICES [address] [place], [place], [address] Phone: [phone]   Fax:  [phone]  Physical Therapy Treatment  Patient Details  Name: Ivan Winters MRN: [id number] Date of Birth: 07-16-1956 Referring Provider: Dr. Melrose Nakayama  Encounter Date: 07/23/2016      PT End of Session - 07/23/16 0903    Visit Number 6   Number of Visits 9   Date for PT Re-Evaluation 08/13/16   PT Start Time 0816   PT Stop Time 0903   PT Time Calculation (min) 47 min   Equipment Utilized During Treatment Gait belt   Activity Tolerance Patient tolerated treatment well   Behavior During Therapy [facility] for tasks assessed/performed      Past Medical History:  Diagnosis Date  . Anxiety   . 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 [place]   per neurology MD MR notes  . pins wrist-left Left   . THYROIDECTOMY      There were no vitals filed for this visit.      Subjective Assessment - 07/23/16 0823    Subjective Patient states that he is "medium" better (pt unable to rate with numerical scales) and states it is only on the left side. Pt states he is only getting symptoms at night time when he goes to lie down.    Patient is accompained by: Family member  sister, Ivan Winters   Pertinent History There is no mention in medical record of learning difficulties however patient is unable to read and part of history provided by his sister, Ivan Winters as patient has difficulty expressing himself at times. In addition, patient has difficulty with numerical rating systems and patient's sister reports that patient has a difficult time with numbers. Patient reports he started having dizziness about 2 years ago. Patient reports vertigo symptoms and states he gets vertigo every day when he goes to lie down at night. Pt's sister Ivan Winters reports that patient  had VNG testing performed at ENT physician office, but she is not sure of test results.  Description of dizziness: vertigo, unsteadiness, lightheadedness, falling, general unsteadiness, aural fullness, nausea. Pt reports he gets headaches sometimes when he wakes up and reports he has usually 1-2 headaches a week. Frequency: every night when he goes to lie down and when he rolls over in bed. Duration: lasts about 1 minute; Symptoms nature: motion provoked/positional, intermittent; Provocatibe Factors: lying down, bending over and rolling over in bed, quick turns; Easing factors: staying still; History of similar episodes: none.     Diagnostic tests VNG tests per sister report, unknown results   Patient Stated Goals to have decreased dizziness and vertigo symptoms especially when he goes to lie down at night.    Currently in Pain? No/denies      Neuromuscular Re-education:  Brandt-Daroff Habituation Exercise: Patient demonstrated how he has been performing Brandt-Daroff exercise at home. Patient needed cuing for technique and then patient was able to demonstrate with good technique. Reviewed and provided a different handout with a larger picture that more clearly depicts the exercise. Patient's sister said that she would review all HEP exercises with the patient on Sunday.   Habituation: Worked on seated habituation exercise of nose towards left knee 10 reps. Pt reports increase in left sided dizziness with this activity.   Body Wall Rolls: Patient performed 4 reps  times 2 (seperated reps as at the end of session reviewed with patient the new exercises for his home exercise program and patient performed all of them again for practice and reinforcement) of supported, body wall rolls with EO. Patient reports increase in dizziness with this activity.   Hallway ball toss: In hallway, worked on ball toss against one wall with alternating quick turns to toss ball against opposite wall.  Diona Foley toss to  self: Performed in sitting static ball toss to self horiz while turning head and eyes to follow ball.    Moving Object in circles while tracking with eyes/head: Performed in sitting, moving an object in circular fashion while progressing the size of the circle while tracking with eyes and head.   Bounce Passes: Performed ambulation 175' trials while doing alternating sides bounce passes to self with ball while tracking ball with eyes and head with vc at times for head turns and tracking the ball.       PT Education - 07/23/16 0826    Education provided Yes   Education Details Spent increased time reviewing HEP- Laruth Bouchard Daroff, body wall rolls, sitting ball toss to self horiz and sitting ball circles. Explained again and reinforced reason for performing HEP.    Person(s) Educated Patient;Other (comment)   Methods Explanation;Demonstration   Comprehension Returned demonstration             PT Long Term Goals - 07/23/16 1024      PT LONG TERM GOAL #1   Title Patient will report 50% or greater improvement in his symptoms of dizziness and imbalance with provoking motions or positions by 08/13/2016.   Baseline difficult to assess secondary to patient unable to rate using % scale. Pt does state that he is no longer having dizziness on the right side and states improvement in dizziness on the left side rated as medium.   Time 8   Period Weeks   Status Partially Met     PT LONG TERM GOAL #2   Title Patient reports no vertigo with lying down and rolling over in bed by 08/13/2016.   Time 8   Period Weeks   Status On-going     PT LONG TERM GOAL #3   Title Patient will have demonstrate decreased falls risk as indicated by Activities Specific Balance Confidence Scale score of 80% or greater by 08/13/2016.   Baseline 66% on 9/8;    Time 8   Period Weeks   Status On-going               Plan - 07/23/16 8115    Clinical Impression Statement Patient demonstrated how he has been  performing Nestor Lewandowsky exercise at home and reviewed again with patient correcting technique. Patient reports that he has been performing but sister states that she is not sure how compliant patient has been with performing HEP consistently and correctly. Patient provided with additional exercises for home exercise program and picture handouts provided. Patient and his sister were educated as to HEP and patient demonstrated the exercises twice at different times during the session to help reinforce education. In addition, patient's sister reports that she plans on reviewing the exercises again with patient on Sunday to help reinforce. Encouraged patient to perform HEP daily and to follow-up as indicated.    Rehab Potential Good   Clinical Impairments Affecting Rehab Potential Positive Indicators: family support   Negative Indicators: chronicity   PT Frequency 1x / week   PT Duration 8 weeks  PT Treatment/Interventions Canalith Repostioning;Neuromuscular re-education;Balance training;Vestibular;Patient/family education;Stair training;Gait training;Therapeutic activities;Therapeutic exercise   PT Next Visit Plan Review Nestor Lewandowsky, work on ambulation with head turn activities, nose towards knee and/ or sitting with horiz head turn habituation exercise possibly    PT Home Exercise Plan feet together and semi-tandem progresssions with horiz and vert head turns and body turns; ambulation with head turns horiz and vert; Brandt-Daroff exercises   Consulted and Agree with Plan of Care Patient;Family member/caregiver   Family Member Consulted sister, Ivan Winters      Patient will benefit from skilled therapeutic intervention in order to improve the following deficits and impairments:  Dizziness, Decreased balance  Visit Diagnosis: Dizziness and giddiness     Problem List There are no active problems to display for this patient.  Lady Deutscher PT, DPT [place],Dorriea 07/23/2016, 10:49 AM  [place] MAIN [facility] SERVICES [address] [place], [place], [address] Phone: [phone]   Fax:  [phone]  Name: Ivan Winters MRN: [id number] Date of Birth: January 21, 1956

## 2016-07-30 ENCOUNTER — Encounter: Payer: Self-pay | Admitting: Physical Therapy

## 2016-07-30 ENCOUNTER — Ambulatory Visit: Payer: BLUE CROSS/BLUE SHIELD | Admitting: Physical Therapy

## 2016-07-30 DIAGNOSIS — R42 Dizziness and giddiness: Secondary | ICD-10-CM

## 2016-07-30 NOTE — Therapy (Signed)
[facility] [address] [place], [place], [address] Phone: [phone]   Fax:  [phone]  Physical Therapy Treatment/Discharge Summary  Patient Details  Name: Ivan Winters MRN: [id number] Date of Birth: 1956-05-26 Referring Provider: Dr. Melrose Nakayama  Encounter Date: 07/30/2016      PT End of Session - 07/30/16 1351    Visit Number 7   Number of Visits 9   Date for PT Re-Evaluation 08/13/16   PT Start Time 0816   PT Stop Time 0909   PT Time Calculation (min) 53 min   Activity Tolerance Patient tolerated treatment well   Behavior During Therapy [facility] for tasks assessed/performed      Past Medical History:  Diagnosis Date  . Anxiety   . 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 [place]   per neurology MD MR notes  . pins wrist-left Left   . THYROIDECTOMY      There were no vitals filed for this visit.      Subjective Assessment - 07/30/16 0817    Subjective Patient states that he still feels the dizziness but states it is "a little bit" less on the left side. Paitent states he is still getting some dizziness at night time when he lies down and turns to the left. Patient when asked about steps reports that he is also still getting some dizziness when looking up when navigating on the stairs at work. Pt states " a little bit" but is unable to rate 0-10 scale. Patient states he is doing his exercises.     Patient is accompained by: Family member  sister, [name]   Pertinent History There is no mention in medical record of learning difficulties however patient is unable to read and part of history provided by his sister, [name] as patient has difficulty expressing himself at times. In addition, patient has difficulty with numerical rating systems and patient's sister reports that patient has a difficult time  with numbers. Patient reports he started having dizziness about 2 years ago. Patient reports vertigo symptoms and states he gets vertigo every day when he goes to lie down at night. Pt's sister [name] reports that patient had VNG testing performed at ENT physician office, but she is not sure of test results.  Description of dizziness: vertigo, unsteadiness, lightheadedness, falling, general unsteadiness, aural fullness, nausea. Pt reports he gets headaches sometimes when he wakes up and reports he has usually 1-2 headaches a week. Frequency: every night when he goes to lie down and when he rolls over in bed. Duration: lasts about 1 minute; Symptoms nature: motion provoked/positional, intermittent; Provocatibe Factors: lying down, bending over and rolling over in bed, quick turns; Easing factors: staying still; History of similar episodes: none.     Diagnostic tests VNG tests per sister report, unknown results   Patient Stated Goals to have decreased dizziness and vertigo symptoms especially when he goes to lie down at night.        Neuromuscular Re-education:  Discussed plan of care with patient and his sister, [name]. Patient and his sister are in agreement with discharge from PT services at this time. Discussed importance of performing HEP daily and spent extensive time reviewing with patient and his sister. Provided with second copy of HEP.  Patient's sister reports that she can tell that patient has improved because  she states she observes the way he moves at home and what activities she has observed that he is now comfortable performing.  Patient secondary to cognition is unable to utilize 0-10 scale and 0-100% rating scales so therefore difficult for patient to report amount of improvement in his symptoms. Patient is unable to read and cannot complete DHI and ABC scale forms. Patient does report that he is no longer having any dizziness on the right and now reports that he has "just a little bit" of  dizziness on the left associated with lying down on his right side and with looking up when ascending stairs which is an improvement as compared to patient's initial report of his symptoms and aggravating movements and activities.  Reviewed and patient demonstrated HEP at beginning of the session and then had patient repeat demonstrating HEP at end of session as well.   Brandt-Daroff Habituation Exercise: Patient demonstrated how he has been performing Brandt-Daroff exercise at home. Patient needed cuing to sit up and switch position and then patient was able to demonstrate with good technique. Patient did do a better with lying on his side with his head turned and he remembered to sit up and lie down on the opposite side this date without cuing which is an improvement because in prior session patient would roll over and lie on his back or he would roll over onto the opposite side without sitting up in bed.   Body Wall Rolls: Patient performed 3 reps times 2 of supported, body wall rolls with EO. Patient reports increase in dizziness with this activity.    Moving Object in circles while tracking with eyes/head: Performed in sitting, moving an object in circular fashion while progressing the size of the circle while tracking with eyes and head and performed in horiz and vert planes. Patient needed occasional vc to track object with head as well as eyes.    Walking with head turns: Performed multiple 61' trials each of forwards ambulation with horiz head turns with modified independence secondary to required min cuing to continue to perform horiz head turns.    Pt reports mild increase in left sided dizziness with all of the above activities.   Additionally performed nose to knee habituation exercise but did not add this to HEP.   Habituation: Worked on seated habituation exercise of nose towards left knee 10 reps. Pt reports increase in left sided dizziness with this activity.          PT  Education - 07/30/16 1347    Education provided Yes   Education Details Spent increased time reviewing patients HEP; discussed progress towards goals and discharge plans, discussed importance of compliance with HEp   Person(s) Educated Patient;Caregiver(s)  sister, [name]   Methods Explanation;Demonstration;Verbal cues   Comprehension Returned demonstration;Verbal cues required             PT Long Term Goals - 07/30/16 1348      PT LONG TERM GOAL #1   Title Patient will report 50% or greater improvement in his symptoms of dizziness and imbalance with provoking motions or positions by 08/13/2016.   Baseline difficult to assess secondary to patient unable to rate using % scale. Pt does state that he is no longer having dizziness on the right side and states improvement in dizziness on the left side stating he is now having "just a little bit".    Time 8   Period Weeks   Status Partially Met  PT LONG TERM GOAL #2   Title Patient reports no vertigo with lying down and rolling over in bed by 08/13/2016.   Baseline patient reports "a little bit" of dizziness now with rolling over in bed.   Time 8   Period Weeks   Status Partially Met     PT LONG TERM GOAL #3   Title Patient will have demonstrate decreased falls risk as indicated by Activities Specific Balance Confidence Scale score of 80% or greater by 08/13/2016.   Baseline 66% on 9/8; discussed with patient's sister [name] and she states that she completed for form for patient initially and states several of the items on the form the patient would not understand what the item is to be able to ask. In  addition the patient is not able to rate items on 0-100% score or 0-10 scale and patient is unable to read.    Time 8   Period Weeks   Status Unable to assess               Plan - 07/30/16 1351    Clinical Impression Statement Discussed plan of care with patient and his sister, [name]. Patient and his sister are in agreement  with discharge from PT services at this time. Discussed importance of performing HEP daily and spent extensive time reviewing with patient and his sister. Provided with second copy of HEP. Patient's sister reports that she can tell that patient has improved because she states she observes the way he moves at home and what activities she has observed that he is now comfortable performing. Patient secondary to cognition is unable to utilize 0-10 scale and 0-100% rating scales so therefore difficult for patient to report amount of improvement in his symptoms. Patient is unable to read and cannot complete DHI and ABC scale forms. Patient does report that he is no longer having any dizziness on the right and now reports that he has "just a little bit" of dizziness on the left associated with lying down on his right side and with looking up when ascending stairs which is an improvement as compared to patient's initial report of his symptoms and aggravating movements and activities.  Reviewed and patient demonstrated HEP at beginning of the session and then had patient repeat demonstrating HEP at end of session as well. Will plan on discharging patient for PT services at this time.    Rehab Potential Good   Clinical Impairments Affecting Rehab Potential Positive Indicators: family support   Negative Indicators: chronicity   PT Frequency 1x / week   PT Duration 8 weeks   PT Treatment/Interventions Canalith Repostioning;Neuromuscular re-education;Balance training;Vestibular;Patient/family education;Stair training;Gait training;Therapeutic activities;Therapeutic exercise   PT Home Exercise Plan feet together and semi-tandem progresssions with horiz and vert head turns and body turns; ambulation with head turns horiz and vert; Brandt-Daroff exercises, body wall rolls, sitting ball circles clockwise, vert and horiz with head and eye follow,    Consulted and Agree with Plan of Care Patient;Family member/caregiver    Family Member Consulted sister, [name]      Patient will benefit from skilled therapeutic intervention in order to improve the following deficits and impairments:  Dizziness, Decreased balance  Visit Diagnosis: Dizziness and giddiness     Problem List There are no active problems to display for this patient.  Lady Deutscher PT, DPT Lady Deutscher 07/30/2016, 2:43 PM  [facility] [address] [place], [place], [address] Phone: [phone]  Fax:  [phone]  Name: Ivan Winters MRN: [id number] Date of Birth: [birth year]

## 2016-08-05 ENCOUNTER — Ambulatory Visit: Payer: Self-pay | Admitting: Physician Assistant

## 2016-08-05 ENCOUNTER — Encounter: Payer: Self-pay | Admitting: Physician Assistant

## 2016-08-05 VITALS — BP 127/86 | HR 91 | Temp 98.0°F

## 2016-08-05 DIAGNOSIS — J0120 Acute ethmoidal sinusitis, unspecified: Secondary | ICD-10-CM

## 2016-08-05 NOTE — Progress Notes (Signed)
   Subjective:Sinus congestion    Patient ID: Ivan Winters, male    DOB: 09-30-1956, 60 y.o.   MRN: [id number]  HPI Patient c/o sinus congestion, post nasal drainage, and non-productive cough for 2 weeks. Denies fever/chill or N/V/D. No palliative measure for compliant.   Review of Systems    Hperthyroidism,  And GERD Objective:   Physical Exam HEENT for bilateral edematous turbinates, thick nasal discharge, and post nasal drainage. Neck supp;e without adenopathy. Lungs CTA with non-productive cough. Heart RRR       Assessment & Plan:Sinusitis  Amoxil and Bromfed DM. Follow up 5 days if no improvement.

## 2016-08-06 ENCOUNTER — Encounter: Payer: BLUE CROSS/BLUE SHIELD | Admitting: Physical Therapy

## 2016-09-22 ENCOUNTER — Ambulatory Visit: Payer: Self-pay | Admitting: Physician Assistant

## 2016-09-23 ENCOUNTER — Encounter: Payer: Self-pay | Admitting: Physician Assistant

## 2016-09-23 ENCOUNTER — Ambulatory Visit: Payer: Self-pay | Admitting: Physician Assistant

## 2016-09-23 VITALS — BP 143/81 | HR 103 | Temp 98.2°F

## 2016-09-23 DIAGNOSIS — L731 Pseudofolliculitis barbae: Secondary | ICD-10-CM

## 2016-09-23 MED ORDER — MUPIROCIN 2 % EX OINT: TOPICAL_OINTMENT | CUTANEOUS | 0 refills | Status: DC

## 2016-09-23 NOTE — Progress Notes (Signed)
S: c/o pimple/cyst on left hand, popped it and got white stuff out, no fever or chills  O: vitals wnl, nad, skin with small infected area around hair on hand, no drainage, no warmth, n/v intact  A: infected ingrown hair  P: bactroban ointment, warm compress

## 2017-01-06 ENCOUNTER — Encounter: Payer: Self-pay | Admitting: Physician Assistant

## 2017-01-06 ENCOUNTER — Ambulatory Visit: Payer: Self-pay | Admitting: Physician Assistant

## 2017-01-06 VITALS — BP 109/60 | HR 93 | Temp 97.5°F

## 2017-01-06 DIAGNOSIS — N23 Unspecified renal colic: Secondary | ICD-10-CM

## 2017-01-06 DIAGNOSIS — R3129 Other microscopic hematuria: Secondary | ICD-10-CM

## 2017-01-06 LAB — POCT URINALYSIS DIPSTICK
Bilirubin, UA: NEGATIVE
Glucose, UA: NEGATIVE
Ketones, UA: NEGATIVE
Leukocytes, UA: NEGATIVE
Nitrite, UA: NEGATIVE
Protein, UA: NEGATIVE
Spec Grav, UA: 1.025 (ref 1.030–1.035)
Urobilinogen, UA: 0.2 (ref ?–2.0)
pH, UA: 6 (ref 5.0–8.0)

## 2017-01-06 NOTE — Progress Notes (Signed)
S: c/o bladder pain, hx of kidney stones, worried he has a bladder infection, no fever/chills/cough/congestion, pt is a poor historian due to speech deficit; hx of thyroid CA,   O: vitals wnl, nad, no cva tenderness, lungs c t a, cv rrr, abd soft nontender, ua 2+ blood  A: hematuria, ?kidney stone  P: f/u with urology

## 2017-01-17 ENCOUNTER — Other Ambulatory Visit: Payer: Self-pay | Admitting: Urology

## 2017-01-17 DIAGNOSIS — R310 Gross hematuria: Secondary | ICD-10-CM

## 2017-01-27 ENCOUNTER — Ambulatory Visit
Admission: RE | Admit: 2017-01-27 | Discharge: 2017-01-27 | Disposition: A | Discharge status: Home / Self Care | Payer: BLUE CROSS/BLUE SHIELD | Source: Ambulatory Visit | Attending: Urology | Admitting: Urology

## 2017-01-27 DIAGNOSIS — I700 Atherosclerosis of aorta: Secondary | ICD-10-CM | POA: Diagnosis not present

## 2017-01-27 DIAGNOSIS — R310 Gross hematuria: Secondary | ICD-10-CM | POA: Insufficient documentation

## 2017-01-27 MED ORDER — IOPAMIDOL (ISOVUE-300) INJECTION 61%
125.0000 mL | Freq: Once | INTRAVENOUS | Status: AC | PRN
  Administered 2017-01-27: 125 mL via INTRAVENOUS

## 2017-06-28 ENCOUNTER — Ambulatory Visit: Payer: Self-pay | Admitting: Physician Assistant

## 2017-10-24 IMAGING — CT CT ABD-PEL WO/W CM
3 of 10 series · 12 of 46 positions shown, 18 images · IV contrast (APPLIED)
Comparison: None.

CLINICAL DATA: LEFT lower quadrant pain for 2 weeks.

EXAM:
CT ABDOMEN AND PELVIS WITHOUT AND WITH CONTRAST
TECHNIQUE: Multidetector CT imaging of the abdomen and pelvis was performed
following the standard protocol before and following the bolus
administration of intravenous contrast.
CONTRAST:  125mL NCLQUF-XRR IOPAMIDOL (NCLQUF-XRR) INJECTION 61%

[Series 2: axial pre · axial · non-contrast · 0.63mm/px · z∈[-906,-856]mm · 2 of 92 slices shown]
[im 11/92  soft-tissue]
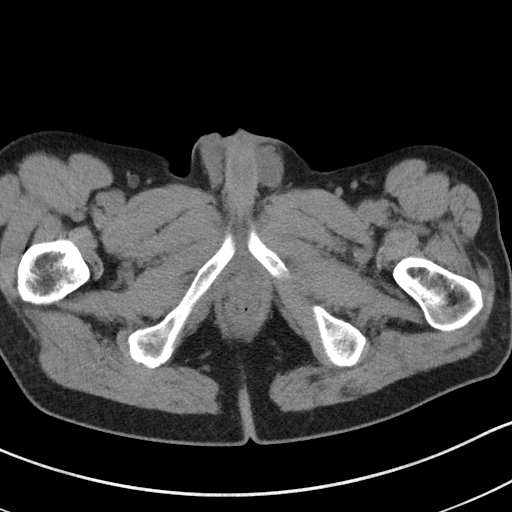
[im 21/92  soft-tissue]
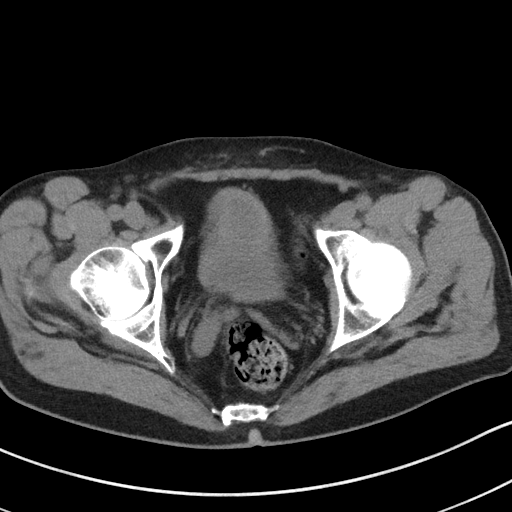

[Series 5: coronal pre · coronal · non-contrast · 0.61mm/px · 2 of 76 slices shown, 3 images]
[im 26/76  soft-tissue]
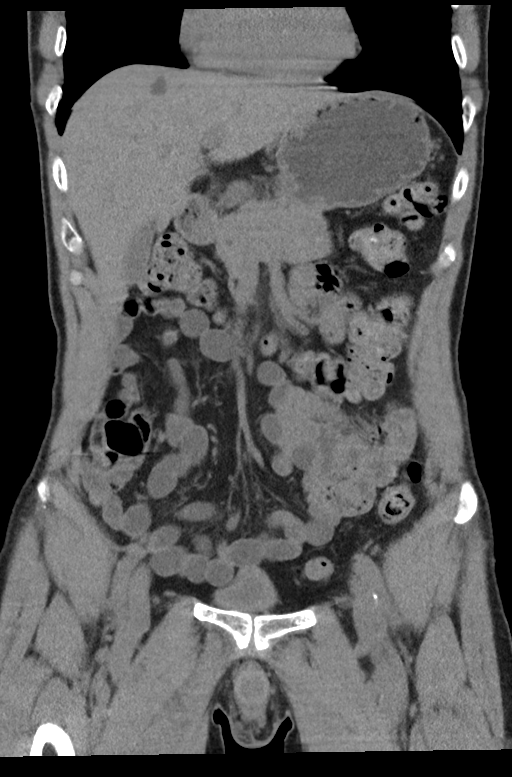
[im 26/76  bone]
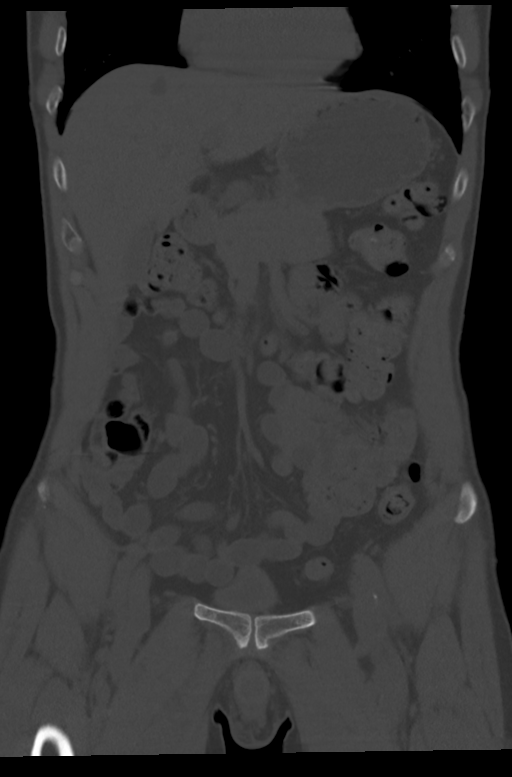
[im 51/76  soft-tissue]
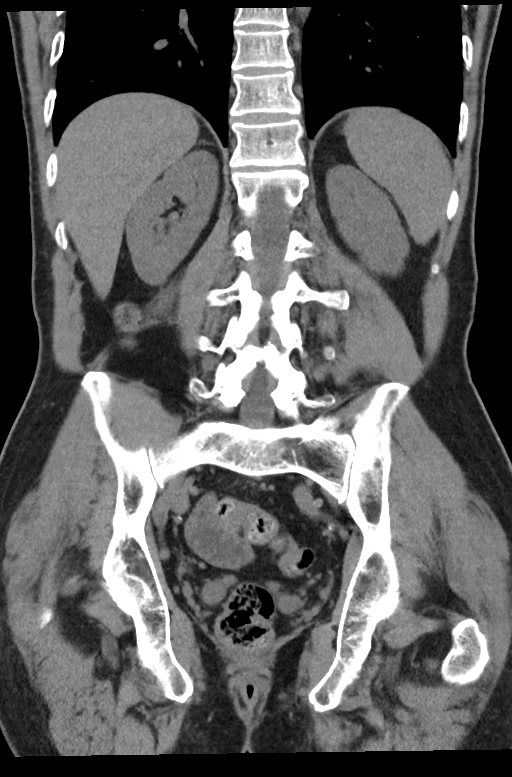

[Series 13: axial delay · axial · delayed · 0.67mm/px · z∈[-470,-115]mm · 8 of 93 slices shown, 13 images]
[im 11/93  soft-tissue]
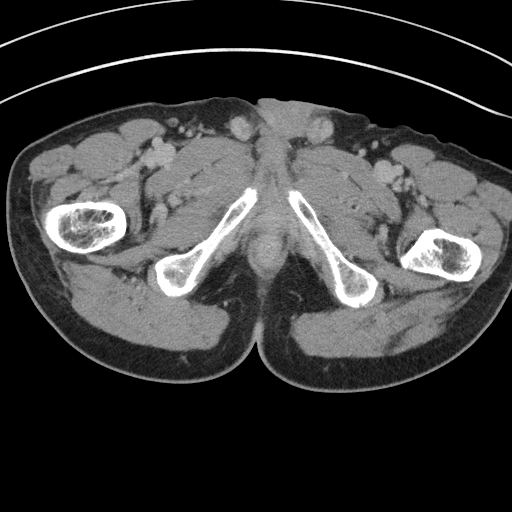
[im 11/93  bone]
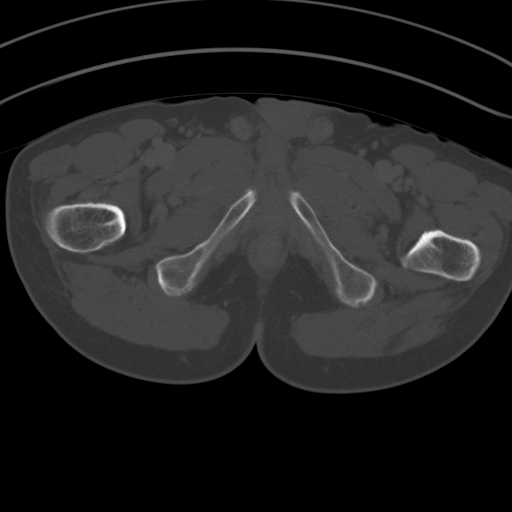
[im 21/93  soft-tissue]
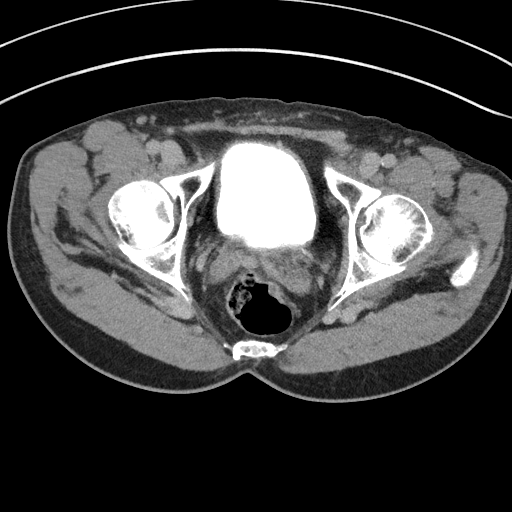
[im 31/93  soft-tissue]
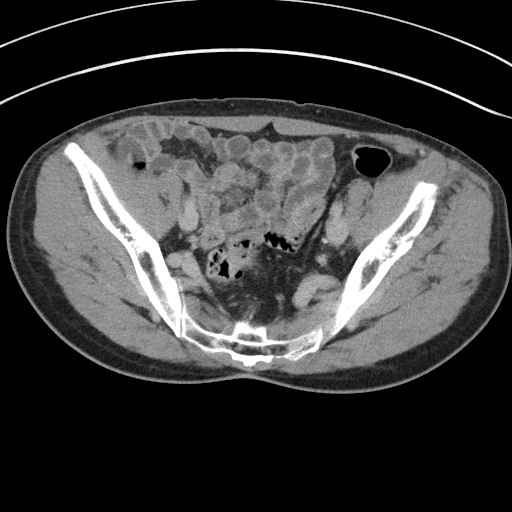
[im 41/93  soft-tissue]
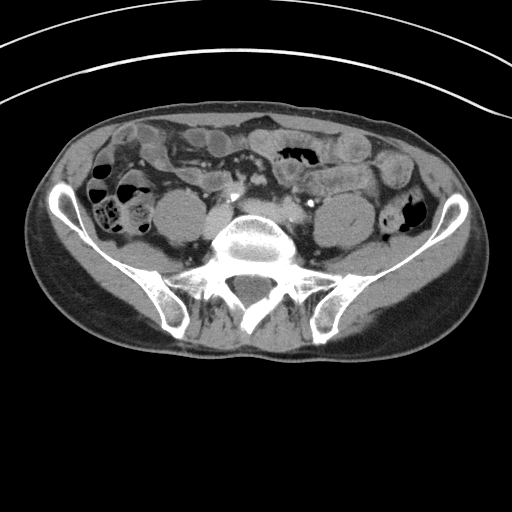
[im 52/93  soft-tissue]
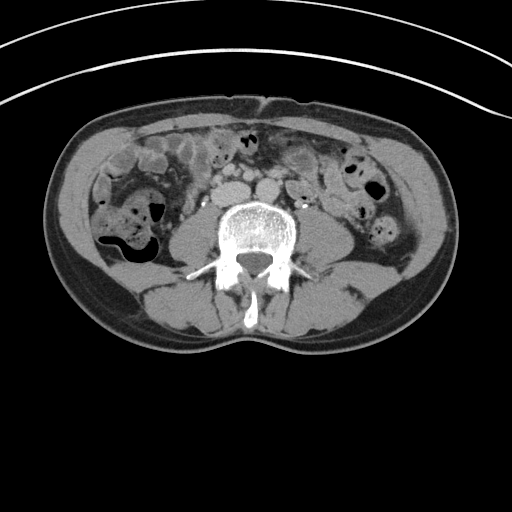
[im 52/93  lung]
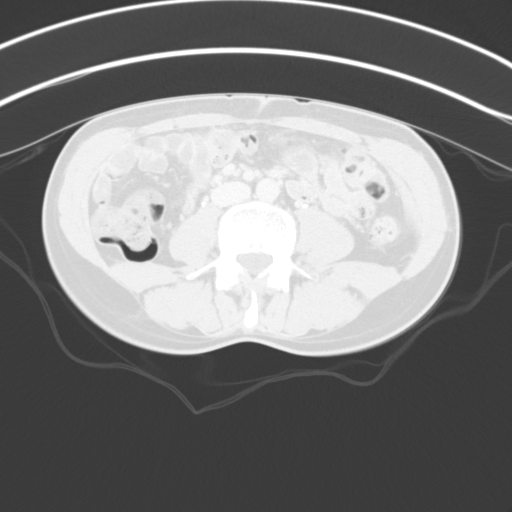
[im 62/93  soft-tissue]
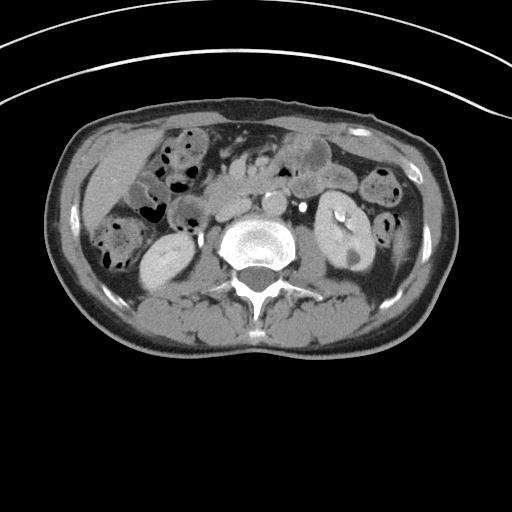
[im 62/93  lung]
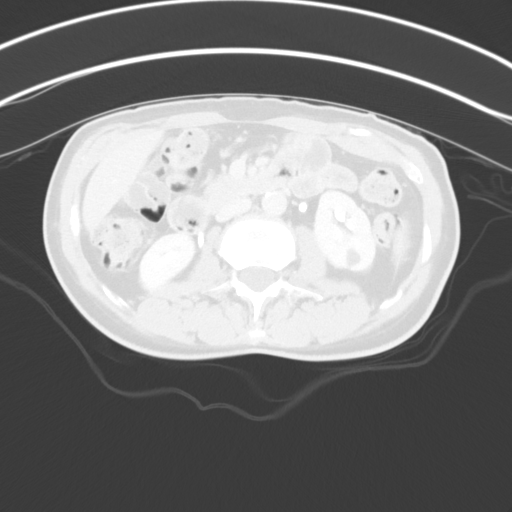
[im 72/93  soft-tissue]
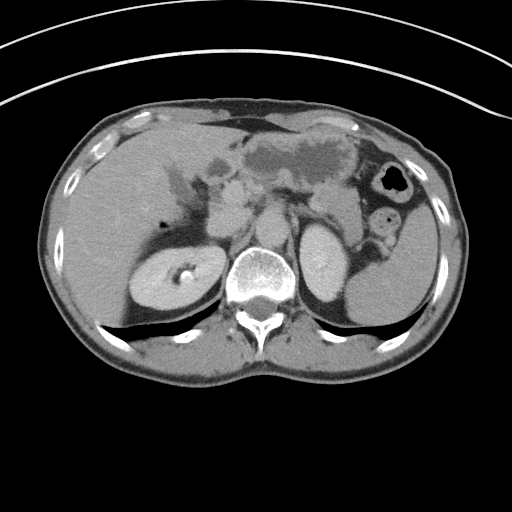
[im 72/93  lung]
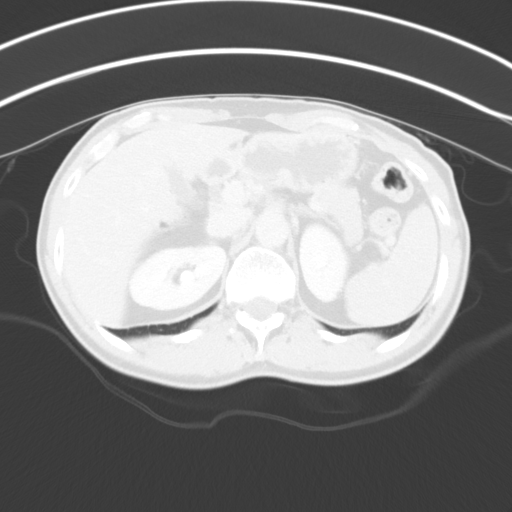
[im 82/93  soft-tissue]
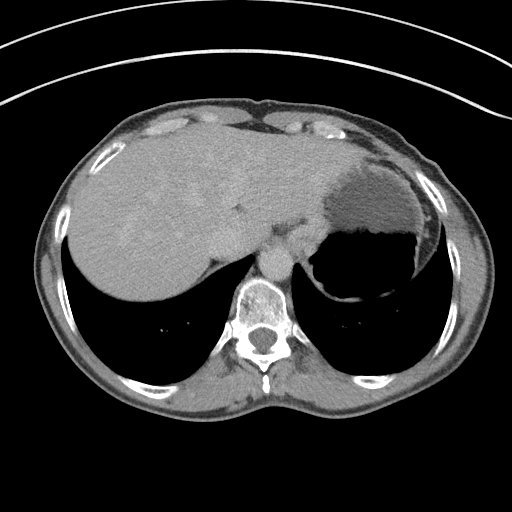
[im 82/93  lung]
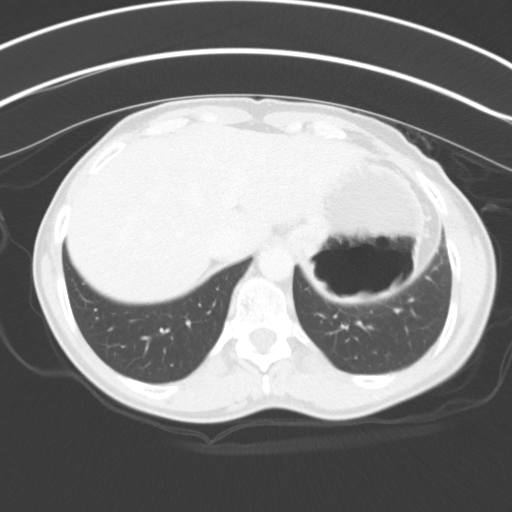

[12 of 46 positions shown; findings below may reference images not displayed]

FINDINGS: Lower chest: Lung bases are clear.

Hepatobiliary: Small cysts in the RIGHT hepatic lobe measures 0.7.
No duct dilatation. Gallbladder normal.

Pancreas: Pancreas is normal. No ductal dilatation. No pancreatic
inflammation.

Spleen: Normal spleen

Adrenals/urinary tract: Adrenal glands and kidneys are normal. The
ureters and bladder normal.

Stomach/Bowel: Stomach, small bowel, appendix, and cecum are normal.
The colon and rectosigmoid colon are normal.

Vascular/Lymphatic: Abdominal aorta is normal caliber with
atherosclerotic calcification. There is no retroperitoneal or
periportal lymphadenopathy. No pelvic lymphadenopathy.

Reproductive: Prostate normal

Other: No free fluid.

Musculoskeletal: No aggressive osseous lesion.
IMPRESSION: 1. No acute findings in the abdomen pelvis.
2. No explanation hematuria.
3.  Atherosclerotic calcification of the aorta (BIJIS-170.0).

## 2019-04-05 ENCOUNTER — Other Ambulatory Visit: Payer: Self-pay

## 2019-04-05 ENCOUNTER — Ambulatory Visit (INDEPENDENT_AMBULATORY_CARE_PROVIDER_SITE_OTHER): Payer: BC Managed Care – PPO | Admitting: Podiatry

## 2019-04-05 ENCOUNTER — Encounter: Payer: Self-pay | Admitting: Podiatry

## 2019-04-05 DIAGNOSIS — Q828 Other specified congenital malformations of skin: Secondary | ICD-10-CM | POA: Diagnosis not present

## 2019-04-05 DIAGNOSIS — M216X1 Other acquired deformities of right foot: Secondary | ICD-10-CM

## 2019-04-05 NOTE — Progress Notes (Addendum)
This patient presents the office with chief complaint of a painful callus on the outside ball of his right foot.  He says that is painful walking and wearing his shoes.  He says it is extremely painful for the last 2 weeks.  He states he has provided no self treatment nor sought any professional help.  He presents the office today for an evaluation and treatment of the painful callus on the right forefoot.  Vascular  Dorsalis pedis and posterior tibial pulses are palpable  B/L.  Capillary return  WNL.  Temperature gradient is  WNL.  Skin turgor  WNL  Sensorium  Senn Weinstein monofilament wire  WNL. Normal tactile sensation.  Nail Exam  Patient has normal nails with no evidence of bacterial or fungal infection.  Orthopedic  Exam  Muscle tone and muscle strength  WNL.  No limitations of motion feet  B/L.  No crepitus or joint effusion noted.  Foot type is unremarkable and digits show no abnormalities. Plantar flex fifth metatarsal right foot. DJD 1st MPJ  B/L.  Skin  No open lesions.  Normal skin texture and turgor. 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   Porokeratosis secondary plantar flexed fifth met head right foot.  IE.  Debride porokeratosis.  m discussed this condition with this patient.  I am not sure patient understood my explanation of his problem.  I told him that the bone on the outside of the right foot was lower and causing the callus to develop.  I told him I will provide conservative treatment today but if the problem persists we can consider more aggressive treatment such as surgery in the future.  RTC 3 months.   Gardiner Barefoot DPM

## 2019-07-05 ENCOUNTER — Ambulatory Visit: Payer: BC Managed Care – PPO | Admitting: Podiatry

## 2019-07-16 ENCOUNTER — Other Ambulatory Visit: Payer: Self-pay | Admitting: Otolaryngology

## 2019-07-16 ENCOUNTER — Other Ambulatory Visit (HOSPITAL_COMMUNITY): Payer: Self-pay | Admitting: Otolaryngology

## 2019-07-16 DIAGNOSIS — R070 Pain in throat: Secondary | ICD-10-CM

## 2019-07-25 ENCOUNTER — Other Ambulatory Visit: Payer: Self-pay

## 2019-07-25 ENCOUNTER — Ambulatory Visit
Admission: RE | Admit: 2019-07-25 | Discharge: 2019-07-25 | Disposition: A | Discharge status: Home / Self Care | Payer: BC Managed Care – PPO | Source: Ambulatory Visit | Attending: Otolaryngology | Admitting: Otolaryngology

## 2019-07-25 DIAGNOSIS — R070 Pain in throat: Secondary | ICD-10-CM | POA: Diagnosis not present

## 2020-08-18 ENCOUNTER — Other Ambulatory Visit: Payer: Self-pay

## 2020-08-18 ENCOUNTER — Emergency Department
Admission: EM | Admit: 2020-08-18 | Discharge: 2020-08-18 | Disposition: A | Discharge status: Home / Self Care | Payer: BC Managed Care – PPO | Attending: Emergency Medicine | Admitting: Emergency Medicine

## 2020-08-18 ENCOUNTER — Encounter: Payer: Self-pay | Admitting: Emergency Medicine

## 2020-08-18 DIAGNOSIS — W010XXA Fall on same level from slipping, tripping and stumbling without subsequent striking against object, initial encounter: Secondary | ICD-10-CM | POA: Diagnosis not present

## 2020-08-18 DIAGNOSIS — S01511A Laceration without foreign body of lip, initial encounter: Secondary | ICD-10-CM

## 2020-08-18 DIAGNOSIS — S01512A Laceration without foreign body of oral cavity, initial encounter: Secondary | ICD-10-CM | POA: Diagnosis not present

## 2020-08-18 DIAGNOSIS — S0993XA Unspecified injury of face, initial encounter: Secondary | ICD-10-CM | POA: Diagnosis present

## 2020-08-18 DIAGNOSIS — Z85850 Personal history of malignant neoplasm of thyroid: Secondary | ICD-10-CM | POA: Insufficient documentation

## 2020-08-18 MED ORDER — LIDOCAINE VISCOUS HCL 2 % MT SOLN
15.0000 mL | Freq: Once | OROMUCOSAL | Status: AC
  Administered 2020-08-18: 15 mL via OROMUCOSAL
  Filled 2020-08-18: qty 15

## 2020-08-18 MED ORDER — TETANUS-DIPHTH-ACELL PERTUSSIS 5-2.5-18.5 LF-MCG/0.5 IM SUSY
0.5000 mL | PREFILLED_SYRINGE | Freq: Once | INTRAMUSCULAR | Status: DC
  Filled 2020-08-18: qty 0.5

## 2020-08-18 MED ORDER — AMOXICILLIN-POT CLAVULANATE 875-125 MG PO TABS: 1.0000 | ORAL_TABLET | Freq: Two times a day (BID) | ORAL | 0 refills | Status: AC

## 2020-08-18 MED ORDER — AMOXICILLIN-POT CLAVULANATE 875-125 MG PO TABS
1.0000 | ORAL_TABLET | Freq: Once | ORAL | Status: AC
Start: 2020-08-18 — End: 2020-08-18
  Administered 2020-08-18: 1 via ORAL
  Filled 2020-08-18: qty 1

## 2020-08-18 NOTE — ED Triage Notes (Signed)
Pt reports tripped and fell cutting his lower lip inside with his teeth. Denies LOC

## 2020-08-18 NOTE — Discharge Instructions (Signed)
You are being treated for a laceration to the lower lip after you accidentally bit yourself. The wound will heal without sutures (stiches). You will need to take the antibiotic twice a day to prevent infection. You will also gargle with warm-salty water after every meal.

## 2020-08-18 NOTE — ED Provider Notes (Signed)
[facility] Emergency Department Provider Note ____________________________________________  Time seen: 1404  I have reviewed the triage vital signs and the nursing notes.  HISTORY  Chief Complaint  Fall   HPI Ivan Winters is a 64 y.o. male presents himself to the ED for evaluation of injury sustained after he took a mechanical fall, and excellently bit the inside of his lower lip.   Patient denies any loss of consciousness, tongue laceration, dental loss, or nosebleed.  He presents with some soft tissue swelling to the lower lip and a laceration does not extend to the exterior portion of the lip or chin.  Past Medical History:  Diagnosis Date  . Anxiety   . 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 Anxiety, generalized 05/12/2016  . BPV (benign positional vertigo) 05/12/2016  . History of thyroid cancer 10/07/2014    Past Surgical History:  Procedure Laterality Date  . [place]   per neurology MD MR notes  . pins wrist-left Left   . THYROIDECTOMY      Prior to Admission medications   Medication Sig Start Date End Date Taking? Authorizing Provider  amoxicillin-clavulanate (AUGMENTIN) 875-125 MG tablet Take 1 tablet by mouth 2 (two) times daily for 10 days. 08/18/20 08/28/20  Libero Puthoff, Dannielle Karvonen, PA-C  Azelastine HCl 137 MCG/SPRAY SOLN  02/12/19   [provider]  divalproex (DEPAKOTE) 125 MG DR tablet Take 125 mg by mouth 2 (two) times daily. 05/12/16   [provider]  dutasteride (AVODART) 0.5 MG capsule Take by mouth.    [provider]  fexofenadine (ALLEGRA) 180 MG tablet Take 1 tablet (180 mg total) by mouth daily. 03/12/16   Caryn Section Linden Dolin, PA-C  levothyroxine (SYNTHROID) 137 MCG tablet  03/06/19   [provider]  Loratadine 10 MG CAPS Take by mouth.    [provider]  ranitidine (ZANTAC) 150 MG capsule Take 150 mg by mouth daily.    [provider]    Allergies Patient has no known allergies.  Family History  Problem Relation Age of Onset  . Hypertension Mother   . Hypothyroidism Mother   . Heart disease Father   . Graves' disease Sister     Social History Social History   Tobacco Use  . Smoking status: Never Smoker  . Smokeless tobacco: Never Used  Substance Use Topics  . Alcohol use: Not on file  . Drug use: Not on file    Review of Systems  Constitutional: Negative for fever. Eyes: Negative for visual changes. ENT: Negative for sore throat.  Lower lip laceration as above. Respiratory: Negative for shortness of breath. Gastrointestinal: Negative for abdominal pain, vomiting and diarrhea. Musculoskeletal: Negative for back pain. Skin: Negative for rash. Neurological: Negative for headaches, focal weakness or numbness. ____________________________________________  PHYSICAL EXAM:  VITAL SIGNS: ED Triage Vitals  Enc Vitals Group     BP 08/18/20 1328 (!) 157/82     Pulse Rate 08/18/20 1328 (!) 116     Resp 08/18/20 1328 20     Temp 08/18/20 1328 98 F (36.7 C)     Temp Source 08/18/20 1328 Oral     SpO2 08/18/20 1328 99 %     Weight 08/18/20 1326 140 lb (63.5 kg)  Height 08/18/20 1326 5\' 6"  (1.676 m)     Head Circumference --      Peak Flow --      Pain Score 08/18/20 1326 7     Pain Loc --      Pain Edu? --      Excl. in [place]? --     Constitutional: Alert and oriented. Well appearing and in no distress. Head: Normocephalic and atraumatic. Eyes: Conjunctivae are normal. Normal extraocular movements Nose: No congestion/rhinorrhea/epistaxis. Mouth/Throat: Mucous membranes are moist.  Poor dentition noted throughout.  Patient with a 1 cm laceration to the lower lip with some surrounding edema.  There is no extension full-thickness to the  exterior portion of the chin.  There is some gray eschar noted to the buccal aspect of the lower lip. Neck: Supple. No thyromegaly. Hematological/Lymphatic/Immunological: No cervical lymphadenopathy. Cardiovascular: Normal rate, regular rhythm. Normal distal pulses. Respiratory: Normal respiratory effort. No wheezes/rales/rhonchi. Musculoskeletal: Nontender with normal range of motion in all extremities.  Neurologic:  Normal gait without ataxia. Normal speech and language. No gross focal neurologic deficits are appreciated. Skin:  Skin is warm, dry and intact. No rash noted. ____________________________________________  PROCEDURES  Augmentin 875-125 p.o. Lidocaine 2% viscous topically.  Procedures ____________________________________________  INITIAL IMPRESSION / ASSESSMENT AND PLAN / ED COURSE  Patient with ED evaluation of an accidental laceration to the buccal portion of the lower lip.  The self-inflicted wound occurred after the patient fell, and excellently bit fifth of his lip.  He presents with some focal soft tissue swelling, early eschar appreciated.  Decision was made to not suture the mucous membrane wound as it appeared to show some signs of early infection.  Patient was prophylaxed with Augmentin he will continue with antibiotic course as prescribed.  He is also advised to rinse the mouth regularly with salty water to help decrease risk of infection.  He will follow with primary provider return to the ED if needed.  Ivan Winters was evaluated in Emergency Department on 08/18/2020 for the symptoms described in the history of present illness. He was evaluated in the context of the global COVID-19 pandemic, which necessitated consideration that the patient might be at risk for infection with the SARS-CoV-2 virus that causes COVID-19. Institutional protocols and algorithms that pertain to the evaluation of patients at risk for COVID-19 are in a state of rapid change based on  information released by regulatory bodies including the CDC and federal and state organizations. These policies and algorithms were followed during the patient's care in the ED. ____________________________________________  FINAL CLINICAL IMPRESSION(S) / ED DIAGNOSES  Final diagnoses:  Lip laceration, initial encounter  Laceration of mouth, initial encounter      Melvenia Needles, PA-C 08/18/20 1919    Merlyn Lot, MD 08/18/20 2021

## 2021-07-22 ENCOUNTER — Encounter: Payer: Self-pay | Admitting: Family Medicine

## 2022-01-25 ENCOUNTER — Ambulatory Visit
Admission: RE | Admit: 2022-01-25 | Discharge: 2022-01-25 | Disposition: A | Discharge status: Home / Self Care | Payer: Medicare Other | Source: Ambulatory Visit | Attending: Orthopedic Surgery | Admitting: Orthopedic Surgery

## 2022-01-25 ENCOUNTER — Other Ambulatory Visit: Payer: Self-pay | Admitting: Orthopedic Surgery

## 2022-01-25 DIAGNOSIS — S52502A Unspecified fracture of the lower end of left radius, initial encounter for closed fracture: Secondary | ICD-10-CM

## 2022-10-27 ENCOUNTER — Ambulatory Visit: Payer: Self-pay | Admitting: Urology

## 2022-10-27 DIAGNOSIS — R3129 Other microscopic hematuria: Secondary | ICD-10-CM

## 2022-11-30 ENCOUNTER — Ambulatory Visit: Payer: Medicare Other | Admitting: Urology

## 2022-11-30 VITALS — BP 126/79 | HR 89 | Wt 145.2 lb

## 2022-11-30 DIAGNOSIS — Q5320 Undescended testicle, unspecified, bilateral: Secondary | ICD-10-CM

## 2022-11-30 DIAGNOSIS — R3129 Other microscopic hematuria: Secondary | ICD-10-CM

## 2022-11-30 LAB — MICROSCOPIC EXAMINATION

## 2022-11-30 LAB — URINALYSIS, COMPLETE
Bilirubin, UA: NEGATIVE
Glucose, UA: NEGATIVE
Ketones, UA: NEGATIVE
Leukocytes,UA: NEGATIVE
Nitrite, UA: NEGATIVE
Protein,UA: NEGATIVE
Specific Gravity, UA: >1.03 — ABNORMAL HIGH (ref 1.005–1.030)
Urobilinogen, Ur: 0.2 mg/dL (ref 0.2–1.0)
pH, UA: 5 (ref 5.0–7.5)

## 2022-11-30 NOTE — Progress Notes (Signed)
I, DeAsia L Maxie,acting as a scribe for Hollice Espy, MD.,have documented all relevant documentation on the behalf of Hollice Espy, MD,as directed by  Hollice Espy, MD while in the presence of Hollice Espy, MD.   I, Amy L Pierron,acting as a scribe for Hollice Espy, MD.,have documented all relevant documentation on the behalf of Hollice Espy, MD,as directed by  Hollice Espy, MD while in the presence of Hollice Espy, MD.   11/30/2022 4:33 PM   Ivan Winters 1956-04-25 [id number]  Referring provider: Macarthur Critchley, MD [address] [place],  [place] [address]  Chief Complaint  Patient presents with   Establish Care   Hematuria    HPI: 67 year-old male who is referred for further evaluation of microscopic hematuria.   He has no previous urinalysis for review. Per his consult notes, his most recent upper tract imaging was on 01/27/2017 in the form of CT abdomen with and without contrast. CT urogram protocol ordered by Dr. Yves Dill for further evaluation of microscopic blood in the urine.   Today's urinalysis shows 11-30 red blood cells per high power field.   He has a personal history of kidney stones.   He reports experiencing a burning sensation during urination but denies any infection. She mentioned that he has a history of smoking, though not heavily or recently, and worked for the city of [place], where he was exposed to basic cleaning products. There was a mention of a previous evaluation for a similar issue in 2018, thought to be a kidney stone, but no significant findings were reported from the scan at that time.   Additionally, his sister mentioned a concern regarding his testicles being undescended, a condition not previously addressed.  He is accompanied by his sister who is in the process of becoming his legal guardian and acts as his healthcare proxy today.  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 If you have any questions, ask your nurse or doctor.          STOP taking these medications    Azelastine HCl 137 MCG/SPRAY Soln Stopped by: Hollice Espy, MD   divalproex 125 MG DR tablet Commonly known as: DEPAKOTE Stopped by: Hollice Espy, MD   dutasteride 0.5 MG capsule Commonly known as: AVODART Stopped by: Hollice Espy, MD   Loratadine 10 MG Caps Stopped by: Hollice Espy, MD   ranitidine 150 MG capsule Commonly known as: ZANTAC Stopped by: Hollice Espy, MD       TAKE these medications    atorvastatin 20 MG tablet Commonly known as: LIPITOR Take 20 mg by mouth daily.   fexofenadine 180 MG tablet Commonly known as: ALLEGRA Take 1 tablet (180 mg total) by mouth daily.   levothyroxine 137 MCG tablet Commonly known as: SYNTHROID   omeprazole 40 MG capsule Commonly known as: PRILOSEC Take 40 mg by mouth daily.        Family History: Family History  Problem Relation Age of Onset   Hypertension Mother    Hypothyroidism Mother    Heart disease Father    Berenice Primas' disease Sister     Social History:  reports that he has never smoked. He has never used smokeless tobacco. No history on file for alcohol use and drug use.   Physical Exam: BP 126/79   Pulse 89   Wt 145 lb 4 oz (65.9 kg)   BMI 23.44 kg/m   Constitutional:  Alert and oriented, No acute distress. HEENT: [place] AT, moist mucus membranes.  Trachea midline, no masses. Neurologic: Grossly intact, no focal deficits, moving all 4 extremities. Psychiatric: Normal mood and affect.  Assessment & Plan:    Microscopic hematuria -  We discussed the differential diagnosis for microscopic hematuria including nephrolithiasis, renal or upper tract tumors, bladder stones, UTIs, or bladder tumors as well as undetermined etiologies. Per AUA guidelines, I did recommend complete microscopic hematuria evaluation including CTU, possible urine cytology, and office cystoscopy.   2. 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 This condition raises the risk for testicular cancer and warrants further evaluation.  - Plan to examine during the cystoscopy appointment and assess via CT scan. If confirmed to be intra-abdominal, surgical removal may be necessary due to the increased risk of cancer.  Return for cystoscopy and CT urogram prior.  I have reviewed the above documentation for accuracy and completeness, and I agree with the above.   Hollice Espy, MD   [facility] [address] [place], [place] [address] [phone]

## 2022-12-07 ENCOUNTER — Ambulatory Visit
Admission: RE | Admit: 2022-12-07 | Discharge: 2022-12-07 | Disposition: A | Discharge status: Home / Self Care | Payer: Medicare Other | Source: Ambulatory Visit | Attending: Urology | Admitting: Urology

## 2022-12-07 DIAGNOSIS — R3129 Other microscopic hematuria: Secondary | ICD-10-CM | POA: Insufficient documentation

## 2022-12-07 MED ORDER — IOHEXOL 300 MG/ML  SOLN
100.0000 mL | Freq: Once | INTRAMUSCULAR | Status: AC | PRN
  Administered 2022-12-07: 100 mL via INTRAVENOUS

## 2022-12-22 ENCOUNTER — Ambulatory Visit: Payer: Medicare Other | Admitting: Urology

## 2022-12-22 VITALS — BP 128/72 | Ht 66.0 in | Wt 145.0 lb

## 2022-12-22 DIAGNOSIS — R3129 Other microscopic hematuria: Secondary | ICD-10-CM | POA: Diagnosis not present

## 2022-12-22 LAB — URINALYSIS, COMPLETE
Bilirubin, UA: NEGATIVE
Glucose, UA: NEGATIVE
Ketones, UA: NEGATIVE
Leukocytes,UA: NEGATIVE
Nitrite, UA: NEGATIVE
Protein,UA: NEGATIVE
Specific Gravity, UA: >1.03 — ABNORMAL HIGH (ref 1.005–1.030)
Urobilinogen, Ur: 0.2 mg/dL (ref 0.2–1.0)
pH, UA: 5.5 (ref 5.0–7.5)

## 2022-12-22 LAB — MICROSCOPIC EXAMINATION: Bacteria, UA: NONE SEEN

## 2022-12-22 NOTE — Progress Notes (Signed)
   12/22/22  CC:  Chief Complaint  Patient presents with   Cysto    HPI: 67 yo M with microscopic hematuria who presents for cysto.    CT Urogram personally reviewed, unremarkable.  Blood pressure 128/72, height 5\' 6"  (1.676 m), weight 145 lb (65.8 kg). NED. A&Ox3.   No respiratory distress   Abd soft, NT, ND Normal phallus with bilateral descended testicles, somewhat higher riding but present, slightly atrophic  Cystoscopy Procedure Note  Patient identification was confirmed, informed consent was obtained, and patient was prepped using Betadine solution.  Lidocaine jelly was administered per urethral meatus.     Pre-Procedure: - Inspection reveals a normal caliber ureteral meatus.  Procedure: The flexible cystoscope was introduced without difficulty - No urethral strictures/lesions are present. - Enlarged prostate  - Normal bladder neck - Bilateral ureteral orifices identified - Bladder mucosa  reveals no ulcers, tumors, or lesions - No bladder stones - No trabeculation  Retroflexion unremarkable   Post-Procedure: - Patient tolerated the procedure well  Assessment/ Plan:  1. Hematuria, microscopic S/p negative evaluation including CT urogram and cysto  F/u 2 years for reevaluation if microscopic hematuria persists or sooner if degree worsens or interval development of gross hematuria  - Urinalysis, Complete  Hollice Espy, MD

## 2024-08-22 ENCOUNTER — Ambulatory Visit: Payer: Self-pay

## 2024-11-06 ENCOUNTER — Emergency Department
Admission: EM | Admit: 2024-11-06 | Discharge: 2024-11-07 | Disposition: A | Attending: Emergency Medicine | Admitting: Emergency Medicine

## 2024-11-06 ENCOUNTER — Other Ambulatory Visit: Payer: Self-pay

## 2024-11-06 DIAGNOSIS — F79 Unspecified intellectual disabilities: Secondary | ICD-10-CM | POA: Insufficient documentation

## 2024-11-06 DIAGNOSIS — Z85850 Personal history of malignant neoplasm of thyroid: Secondary | ICD-10-CM | POA: Diagnosis not present

## 2024-11-06 DIAGNOSIS — R1084 Generalized abdominal pain: Secondary | ICD-10-CM | POA: Insufficient documentation

## 2024-11-06 DIAGNOSIS — R1030 Lower abdominal pain, unspecified: Secondary | ICD-10-CM | POA: Diagnosis present

## 2024-11-06 HISTORY — DX: Unspecified lack of expected normal physiological development in childhood: R62.50

## 2024-11-06 LAB — COMPREHENSIVE METABOLIC PANEL WITH GFR
ALT: 16 U/L (ref 0–44)
AST: 18 U/L (ref 15–41)
Albumin: 4.5 g/dL (ref 3.5–5.0)
Alkaline Phosphatase: 69 U/L (ref 38–126)
Anion gap: 14 (ref 5–15)
BUN: 13 mg/dL (ref 8–23)
CO2: 25 mmol/L (ref 22–32)
Calcium: 8.7 mg/dL — ABNORMAL LOW (ref 8.9–10.3)
Chloride: 99 mmol/L (ref 98–111)
Creatinine, Ser: 1 mg/dL (ref 0.61–1.24)
GFR, Estimated: >60 mL/min
Glucose, Bld: 122 mg/dL — ABNORMAL HIGH (ref 70–99)
Potassium: 3.4 mmol/L — ABNORMAL LOW (ref 3.5–5.1)
Sodium: 138 mmol/L (ref 135–145)
Total Bilirubin: 0.4 mg/dL (ref 0.0–1.2)
Total Protein: 7.1 g/dL (ref 6.5–8.1)

## 2024-11-06 LAB — CBC
HCT: 38.8 % — ABNORMAL LOW (ref 39.0–52.0)
Hemoglobin: 13.6 g/dL (ref 13.0–17.0)
MCH: 32.5 pg (ref 26.0–34.0)
MCHC: 35.1 g/dL (ref 30.0–36.0)
MCV: 92.8 fL (ref 80.0–100.0)
Platelets: 346 10*3/uL (ref 150–400)
RBC: 4.18 MIL/uL — ABNORMAL LOW (ref 4.22–5.81)
RDW: 12.4 % (ref 11.5–15.5)
WBC: 9 10*3/uL (ref 4.0–10.5)
nRBC: 0 % (ref 0.0–0.2)

## 2024-11-06 LAB — LIPASE, BLOOD: Lipase: 43 U/L (ref 11–51)

## 2024-11-06 NOTE — ED Triage Notes (Signed)
 Pt arrives via EMS for mid to lower abd pain on the right side. Per EMS pt does have developmental delay and has been complaining to his POA about said pain after he passed a kidney stone.

## 2024-11-07 ENCOUNTER — Emergency Department

## 2024-11-07 MED ORDER — NAPROXEN 500 MG PO TABS
500.0000 mg | ORAL_TABLET | Freq: Once | ORAL | Status: AC
  Administered 2024-11-07: 500 mg via ORAL
  Filled 2024-11-07: qty 1

## 2024-11-07 NOTE — ED Notes (Signed)
 Pts sister and POA Harriet) states transportation is not available until later this morning when it is safer to travel on roads.

## 2024-11-07 NOTE — Discharge Instructions (Signed)
 Your examination, lab test, and CT scan of the abdomen were all okay today.  Please follow-up with your doctor for further evaluation of your symptoms.

## 2024-11-07 NOTE — ED Provider Notes (Signed)
 "  [facility] Provider Note    Event Date/Time   First MD Initiated Contact with Patient 11/06/24 2345     (approximate)   History   Chief Complaint: Abdominal Pain   HPI  Ivan Winters is a 69 y.o. male with a history of anxiety, intellectual disability who comes ED complaining of lower abdominal pain for the past 2 days.  Family reports that he had been complaining of this pain ever since passing a kidney stone.  Patient denies fever or chest pain.  Discussed with patient's sister who denies any other known symptoms recently       Past Medical History:  Diagnosis Date   Anxiety    Developmental delay disorder    Malignant neoplasm of thyroid  gland ([facility]) 02/2014   Per neurology MD MR notes    Current Outpatient Rx   Order #: [id number] Class: Historical Med   Order #: [id number] Class: Normal   Order #: [id number] Class: Historical Med   Order #: [id number] Class: Historical Med    Past Surgical History:  Procedure Laterality Date   INGUINAL HERNIA REPAIR  1965   per neurology MD MR notes   pins wrist-left Left    THYROIDECTOMY      Physical Exam   Triage Vital Signs: ED Triage Vitals [11/06/24 1828]  Encounter Vitals Group     BP (!) 154/84     Girls Systolic BP Percentile      Girls Diastolic BP Percentile      Boys Systolic BP Percentile      Boys Diastolic BP Percentile      Pulse Rate 98     Resp 17     Temp 97.8 F (36.6 C)     Temp Source Oral     SpO2 100 %     Weight      Height      Head Circumference      Peak Flow      Pain Score 7     Pain Loc      Pain Education      Exclude from Growth Chart     Most recent vital signs: Vitals:   11/06/24 1828 11/06/24 2334  BP: (!) 154/84 (!) 153/91  Pulse: 98 90  Resp: 17 18  Temp: 97.8 F (36.6 C) 98 F (36.7 C)  SpO2: 100% 100%    General: Awake, no distress.  CV:  Good peripheral perfusion.  Regular rate rhythm Resp:  Normal effort.  Clear lungs Abd:  No  distention.  Soft with mild generalized tenderness, nonfocal Other:  Moist oral mucosa   ED Results / Procedures / Treatments   Labs (all labs ordered are listed, but only abnormal results are displayed) Labs Reviewed  COMPREHENSIVE METABOLIC PANEL WITH GFR - Abnormal; Notable for the following components:      Result Value   Potassium 3.4 (*)    Glucose, Bld 122 (*)    Calcium 8.7 (*)    All other components within normal limits  CBC - Abnormal; Notable for the following components:   RBC 4.18 (*)    HCT 38.8 (*)    All other components within normal limits  LIPASE, BLOOD     EKG    RADIOLOGY CT abdomen pelvis interpreted by me, negative for kidney stones.  Radiology report reviewed   PROCEDURES:  Procedures   MEDICATIONS ORDERED IN ED: Medications  naproxen  (NAPROSYN ) tablet 500 mg (500 mg Oral Given 11/07/24 0117)  IMPRESSION / MDM / ASSESSMENT AND PLAN / ED COURSE  I reviewed the triage vital signs and the nursing notes.  DDx: Kidney stones, pancreatitis, GERD, anxiety, viral illness, constipation  Patient's presentation is most consistent with acute presentation with potential threat to life or bodily function.     Clinical Course as of 11/07/24 0615  Wed Nov 07, 2024  0003 P/w diffuse abd pain with tenderness. Nonfocal, no peritonitis. Vitals normal. Hx and exam limited by intellectual disability - will obtain CT. [PS]    Clinical Course User Index [PS] Viviann Pastor, MD    ----------------------------------------- 6:14 AM on 11/07/2024 ----------------------------------------- CT reassuring, labs and vital signs unremarkable.  He is nontoxic.  Discussed with his sister who is in agreement with discharge and primary care follow-up   FINAL CLINICAL IMPRESSION(S) / ED DIAGNOSES   Final diagnoses:  Generalized abdominal pain     Rx / DC Orders   ED Discharge Orders     None        Note:  This document was prepared using  Dragon voice recognition software and may include unintentional dictation errors.   Viviann Pastor, MD 11/07/24 [phone]  "

## 2024-11-08 ENCOUNTER — Emergency Department (HOSPITAL_COMMUNITY)
Admission: EM | Admit: 2024-11-08 | Discharge: 2024-11-09 | Disposition: A | Discharge status: Home / Self Care | Attending: Emergency Medicine | Admitting: Emergency Medicine

## 2024-11-08 ENCOUNTER — Emergency Department (HOSPITAL_COMMUNITY)

## 2024-11-08 ENCOUNTER — Other Ambulatory Visit: Payer: Self-pay

## 2024-11-08 ENCOUNTER — Encounter (HOSPITAL_COMMUNITY): Payer: Self-pay

## 2024-11-08 DIAGNOSIS — R079 Chest pain, unspecified: Secondary | ICD-10-CM | POA: Insufficient documentation

## 2024-11-08 DIAGNOSIS — Z79899 Other long term (current) drug therapy: Secondary | ICD-10-CM | POA: Insufficient documentation

## 2024-11-08 DIAGNOSIS — F419 Anxiety disorder, unspecified: Secondary | ICD-10-CM | POA: Diagnosis not present

## 2024-11-08 DIAGNOSIS — R4182 Altered mental status, unspecified: Secondary | ICD-10-CM | POA: Diagnosis not present

## 2024-11-08 DIAGNOSIS — E876 Hypokalemia: Secondary | ICD-10-CM | POA: Insufficient documentation

## 2024-11-08 DIAGNOSIS — R1030 Lower abdominal pain, unspecified: Secondary | ICD-10-CM | POA: Diagnosis present

## 2024-11-08 DIAGNOSIS — F88 Other disorders of psychological development: Secondary | ICD-10-CM | POA: Diagnosis not present

## 2024-11-08 DIAGNOSIS — F79 Unspecified intellectual disabilities: Secondary | ICD-10-CM | POA: Diagnosis not present

## 2024-11-08 DIAGNOSIS — F411 Generalized anxiety disorder: Secondary | ICD-10-CM | POA: Diagnosis present

## 2024-11-08 DIAGNOSIS — F69 Unspecified disorder of adult personality and behavior: Secondary | ICD-10-CM | POA: Diagnosis not present

## 2024-11-08 DIAGNOSIS — F22 Delusional disorders: Secondary | ICD-10-CM

## 2024-11-08 DIAGNOSIS — R059 Cough, unspecified: Secondary | ICD-10-CM | POA: Diagnosis present

## 2024-11-08 LAB — CBC
HCT: 37.6 % — ABNORMAL LOW (ref 39.0–52.0)
Hemoglobin: 13.3 g/dL (ref 13.0–17.0)
MCH: 33.1 pg (ref 26.0–34.0)
MCHC: 35.4 g/dL (ref 30.0–36.0)
MCV: 93.5 fL (ref 80.0–100.0)
Platelets: 330 10*3/uL (ref 150–400)
RBC: 4.02 MIL/uL — ABNORMAL LOW (ref 4.22–5.81)
RDW: 12.2 % (ref 11.5–15.5)
WBC: 9.6 10*3/uL (ref 4.0–10.5)
nRBC: 0 % (ref 0.0–0.2)

## 2024-11-08 LAB — COMPREHENSIVE METABOLIC PANEL WITH GFR
ALT: 16 U/L (ref 0–44)
AST: 21 U/L (ref 15–41)
Albumin: 4.4 g/dL (ref 3.5–5.0)
Alkaline Phosphatase: 65 U/L (ref 38–126)
Anion gap: 13 (ref 5–15)
BUN: 11 mg/dL (ref 8–23)
CO2: 24 mmol/L (ref 22–32)
Calcium: 8.9 mg/dL (ref 8.9–10.3)
Chloride: 97 mmol/L — ABNORMAL LOW (ref 98–111)
Creatinine, Ser: 0.97 mg/dL (ref 0.61–1.24)
GFR, Estimated: >60 mL/min
Glucose, Bld: 115 mg/dL — ABNORMAL HIGH (ref 70–99)
Potassium: 3.2 mmol/L — ABNORMAL LOW (ref 3.5–5.1)
Sodium: 135 mmol/L (ref 135–145)
Total Bilirubin: 0.5 mg/dL (ref 0.0–1.2)
Total Protein: 7.2 g/dL (ref 6.5–8.1)

## 2024-11-08 LAB — LIPASE, BLOOD: Lipase: 32 U/L (ref 11–51)

## 2024-11-08 LAB — TROPONIN T, HIGH SENSITIVITY: Troponin T High Sensitivity: 13 ng/L (ref 0–19)

## 2024-11-08 MED ORDER — POTASSIUM CHLORIDE CRYS ER 20 MEQ PO TBCR
40.0000 meq | EXTENDED_RELEASE_TABLET | Freq: Once | ORAL | Status: AC
  Administered 2024-11-09: 40 meq via ORAL
  Filled 2024-11-08: qty 2

## 2024-11-08 MED ORDER — NAPROXEN 250 MG PO TABS
500.0000 mg | ORAL_TABLET | Freq: Once | ORAL | Status: AC
  Administered 2024-11-09: 500 mg via ORAL
  Filled 2024-11-08: qty 2

## 2024-11-08 NOTE — ED Provider Triage Note (Signed)
 Emergency Medicine Provider Triage Evaluation Note  Ivan Winters , a 69 y.o. male  was evaluated in triage.  Pt complains of per report 'not himself' in past two weeks, intermittently c/o upper abdominal pain and/or chest pain. Per report had c/o headache earlier, and ?hallucinations (although currently no headache, and does not appear to be responding to internal stimuli). Hx developmental delay - very limited historian.   Review of Systems  Positive: Abd pain, cp. Cough. Negative: Fever. Vomiting. Gu c/o.   Physical Exam  BP (!) 164/99 (BP Location: Left Arm)   Pulse (!) 102   Temp 98.5 F (36.9 C)   Resp 17   Ht 1.676 m (5' 6)   Wt 65.8 kg   SpO2 99%   BMI 23.41 kg/m  Gen:   Awake, no distress   Resp:  Normal effort breathing comfortably Abd:   Soft non tender.  MSK:   Moves extremities without difficulty no swelling CV:  RRR. No g/r/m Heent:  Atraumatic. No sinus or temporal tenderness. Neck:  Moves freely and comfortably in all directions, no stiffness or rigidity.   Medical Decision Making  Medically screening exam initiated at 10:08 PM.  Appropriate orders placed.  Ivan Winters was informed that the remainder of the evaluation will be completed by another provider, this initial triage assessment does not replace that evaluation, and the importance of remaining in the ED until their evaluation is complete.  Had recent abd pain workup incl ct neg acute. Labs and imaging ordered.      Bernard Drivers, MD 11/08/24 2211

## 2024-11-08 NOTE — ED Notes (Addendum)
 Please call sister Apolinar with any updates  [phone]

## 2024-11-08 NOTE — ED Triage Notes (Signed)
 Pt BIB [facility] from home c/o of abdominal pain x4 days. Per EMS and family, pt has developmental delay. Per family, pt has not been his usual baseline x2 weeks - hallucinating, paranoia, temper, and poor appetite. Per family pt has no n/v/diarrhea  A&Ox2   EMS vitals  160/88  109 HR  18 rr  97% SP02 room air  175 cbg

## 2024-11-08 NOTE — ED Notes (Signed)
 Pt states that his left foot has been hurting him for about 2 months

## 2024-11-09 ENCOUNTER — Encounter (HOSPITAL_COMMUNITY): Payer: Self-pay | Admitting: Psychiatry

## 2024-11-09 ENCOUNTER — Emergency Department
Admission: EM | Admit: 2024-11-09 | Discharge: 2024-11-10 | Disposition: A | Attending: Emergency Medicine | Admitting: Emergency Medicine

## 2024-11-09 ENCOUNTER — Other Ambulatory Visit: Payer: Self-pay

## 2024-11-09 DIAGNOSIS — F419 Anxiety disorder, unspecified: Secondary | ICD-10-CM | POA: Diagnosis present

## 2024-11-09 DIAGNOSIS — F989 Unspecified behavioral and emotional disorders with onset usually occurring in childhood and adolescence: Secondary | ICD-10-CM | POA: Insufficient documentation

## 2024-11-09 DIAGNOSIS — F29 Unspecified psychosis not due to a substance or known physiological condition: Secondary | ICD-10-CM | POA: Insufficient documentation

## 2024-11-09 DIAGNOSIS — R410 Disorientation, unspecified: Secondary | ICD-10-CM | POA: Insufficient documentation

## 2024-11-09 DIAGNOSIS — F69 Unspecified disorder of adult personality and behavior: Secondary | ICD-10-CM | POA: Diagnosis not present

## 2024-11-09 DIAGNOSIS — F79 Unspecified intellectual disabilities: Secondary | ICD-10-CM

## 2024-11-09 DIAGNOSIS — R109 Unspecified abdominal pain: Secondary | ICD-10-CM | POA: Insufficient documentation

## 2024-11-09 DIAGNOSIS — F22 Delusional disorders: Secondary | ICD-10-CM | POA: Insufficient documentation

## 2024-11-09 DIAGNOSIS — R4689 Other symptoms and signs involving appearance and behavior: Secondary | ICD-10-CM

## 2024-11-09 DIAGNOSIS — R079 Chest pain, unspecified: Secondary | ICD-10-CM | POA: Insufficient documentation

## 2024-11-09 DIAGNOSIS — R000 Tachycardia, unspecified: Secondary | ICD-10-CM | POA: Insufficient documentation

## 2024-11-09 LAB — URINALYSIS, ROUTINE W REFLEX MICROSCOPIC
Bacteria, UA: NONE SEEN
Bilirubin Urine: NEGATIVE
Glucose, UA: NEGATIVE mg/dL
Ketones, ur: 5 mg/dL — AB
Leukocytes,Ua: NEGATIVE
Nitrite: NEGATIVE
Protein, ur: NEGATIVE mg/dL
Specific Gravity, Urine: 1.008 (ref 1.005–1.030)
pH: 6 (ref 5.0–8.0)

## 2024-11-09 LAB — COMPREHENSIVE METABOLIC PANEL WITH GFR
ALT: 14 U/L (ref 0–44)
AST: 21 U/L (ref 15–41)
Albumin: 4.5 g/dL (ref 3.5–5.0)
Alkaline Phosphatase: 71 U/L (ref 38–126)
Anion gap: 15 (ref 5–15)
BUN: 14 mg/dL (ref 8–23)
CO2: 23 mmol/L (ref 22–32)
Calcium: 9.6 mg/dL (ref 8.9–10.3)
Chloride: 101 mmol/L (ref 98–111)
Creatinine, Ser: 0.96 mg/dL (ref 0.61–1.24)
GFR, Estimated: >60 mL/min
Glucose, Bld: 116 mg/dL — ABNORMAL HIGH (ref 70–99)
Potassium: 4 mmol/L (ref 3.5–5.1)
Sodium: 139 mmol/L (ref 135–145)
Total Bilirubin: 0.5 mg/dL (ref 0.0–1.2)
Total Protein: 7.5 g/dL (ref 6.5–8.1)

## 2024-11-09 LAB — CBC
HCT: 40.3 % (ref 39.0–52.0)
Hemoglobin: 13.9 g/dL (ref 13.0–17.0)
MCH: 32.4 pg (ref 26.0–34.0)
MCHC: 34.5 g/dL (ref 30.0–36.0)
MCV: 93.9 fL (ref 80.0–100.0)
Platelets: 367 10*3/uL (ref 150–400)
RBC: 4.29 MIL/uL (ref 4.22–5.81)
RDW: 12.3 % (ref 11.5–15.5)
WBC: 9.2 10*3/uL (ref 4.0–10.5)
nRBC: 0 % (ref 0.0–0.2)

## 2024-11-09 LAB — URINE DRUG SCREEN
Amphetamines: NEGATIVE
Barbiturates: NEGATIVE
Benzodiazepines: NEGATIVE
Cocaine: NEGATIVE
Fentanyl: NEGATIVE
Methadone Scn, Ur: NEGATIVE
Opiates: NEGATIVE
Tetrahydrocannabinol: NEGATIVE

## 2024-11-09 LAB — TSH: TSH: 1.31 u[IU]/mL (ref 0.350–4.500)

## 2024-11-09 LAB — TROPONIN T, HIGH SENSITIVITY
Troponin T High Sensitivity: 14 ng/L (ref 0–19)
Troponin T High Sensitivity: 19 ng/L (ref 0–19)

## 2024-11-09 LAB — HIV ANTIBODY (ROUTINE TESTING W REFLEX): HIV Screen 4th Generation wRfx: NONREACTIVE

## 2024-11-09 LAB — ETHANOL: Alcohol, Ethyl (B): <15 mg/dL

## 2024-11-09 MED ORDER — HYDROXYZINE HCL 25 MG PO TABS: 25.0000 mg | ORAL_TABLET | Freq: Three times a day (TID) | ORAL | 0 refills | Status: AC

## 2024-11-09 MED ORDER — ATORVASTATIN CALCIUM 10 MG PO TABS
20.0000 mg | ORAL_TABLET | Freq: Every day | ORAL | Status: DC
  Administered 2024-11-09: 20 mg via ORAL
  Filled 2024-11-09: qty 2

## 2024-11-09 NOTE — ED Provider Notes (Signed)
 "  [facility] Provider Note    Event Date/Time   First MD Initiated Contact with Patient 11/09/24 2307     (approximate)   History   Psychiatric Evaluation   HPI  Ivan Winters is a 69 y.o. male with a history of intellectual debility and anxiety who presents due to behavior concerns.  Per EMS, the patient was discharged from [facility] today and to fold follow-up with psychiatry at [facility].  The patient was reporting chest pain and abdominal pain at that time so was brought to the ED.  EMS was concerned because the patient wandered away from them multiple times and had to be redirected.  Currently, the patient does report some abdominal pain although he states that has been present for 12 weeks.  He points to the middle of his abdomen.  He is unable to give me any other history about it, although denies other pain.  He attempts to bring up other medical concerns.  At one point he pulled down his pants and showed me his pubic area and penis, stating that there was something wrong, and asking me repeatedly what is it?  However, he could not identify what it was that he was trying to show me.  The patient repeatedly asked if he was dying.  I reviewed the past medical records.  The patient was seen at the [facility], ED early this morning reporting chest pain and abdominal pain.  He had a CT renal stone study done on 1/28 which showed no acute abnormalities.  Chest x-ray yesterday was also negative.  CT head was negative.  Family had reported that time that the patient demonstrated paranoid thoughts, thinking that he had prostate cancer and that his penis was shrinking.  Lab workup and imaging during this visit were unremarkable.  The patient was medically cleared.  He was seen by psychiatry.  The provider felt that he likely was presenting with magical thinking due to his severe limited intellectual capacity and anxiety, with a lower suspicion for psychosis.  He was  psychiatrically cleared.   Physical Exam   Triage Vital Signs: ED Triage Vitals  Encounter Vitals Group     BP 11/09/24 2156 (!) 144/89     Girls Systolic BP Percentile --      Girls Diastolic BP Percentile --      Boys Systolic BP Percentile --      Boys Diastolic BP Percentile --      Pulse Rate 11/09/24 2156 (!) 110     Resp 11/09/24 2156 18     Temp 11/09/24 2156 98.2 F (36.8 C)     Temp Source 11/09/24 2156 Oral     SpO2 11/09/24 2156 95 %     Weight 11/09/24 2154 145 lb 1 oz (65.8 kg)     Height 11/09/24 2154 5' 6 (1.676 m)     Head Circumference --      Peak Flow --      Pain Score 11/09/24 2154 4     Pain Loc --      Pain Education --      Exclude from Growth Chart --     Most recent vital signs: Vitals:   11/09/24 2156  BP: (!) 144/89  Pulse: (!) 110  Resp: 18  Temp: 98.2 F (36.8 C)  SpO2: 95%     General: Alert, anxious appearing, no distress.  CV:  Good peripheral perfusion.  Resp:  Normal effort.  Abd:  Soft with no focal tenderness.  No distention.  Other:  No jaundice or scleral icterus.   ED Results / Procedures / Treatments   Labs (all labs ordered are listed, but only abnormal results are displayed) Labs Reviewed  COMPREHENSIVE METABOLIC PANEL WITH GFR - Abnormal; Notable for the following components:      Result Value   Glucose, Bld 116 (*)    All other components within normal limits  ETHANOL  CBC  URINE DRUG SCREEN  LIPASE, BLOOD  TROPONIN T, HIGH SENSITIVITY  TROPONIN T, HIGH SENSITIVITY     EKG    RADIOLOGY    PROCEDURES:  Critical Care performed: No  Procedures   MEDICATIONS ORDERED IN ED: Medications - No data to display   IMPRESSION / MDM / ASSESSMENT AND PLAN / ED COURSE  I reviewed the triage vital signs and the nursing notes.  69 year old male with PMH as noted above presents with behavior concerns, behaving erratically and eloping from EMS in addition to paranoid thoughts and abdominal pain after  being discharged from [facility] this morning.  Vital signs are normal except for borderline tachycardia.  Physical exam is unremarkable.  Differential diagnosis includes, but is not limited to, intellectual disability, acute anxiety, psychosis.  Given the recent negative workup, there is no indication for any acute intra-abdominal process.  We will obtain repeat labs today, psychiatry and TTS consults, and reassess.  Patient's presentation is most consistent with exacerbation of chronic illness.  The patient has been placed in psychiatric observation due to the need to provide a safe environment for the patient while obtaining psychiatric consultation and evaluation, as well as ongoing medical and medication management to treat the patient's condition.  The patient has not been placed under full IVC at this time.    FINAL CLINICAL IMPRESSION(S) / ED DIAGNOSES   Final diagnoses:  None     Rx / DC Orders   ED Discharge Orders     None        Note:  This document was prepared using Dragon voice recognition software and may include unintentional dictation errors.  "

## 2024-11-09 NOTE — BH Assessment (Signed)
 Clinician attempted to assess the pt however the pt is hard of hearing and hard to understand. Lamarr, RN tried translating as much has she could to the pt and clinician but recommends it would be better for the pt to be assessed in person. Clinician reports, there will be a NP at [facility] during the day shift to assess the pt.     Jackson JONETTA Broach, MS, [facility], [facility] Triage Specialist [phone]

## 2024-11-09 NOTE — ED Notes (Signed)
 Patient was transferred to the [facility] without incident. Upon arrival, the patient was placed in their assigned room, oriented to the unit, and reviewed unit guidelines/rules with the RN. The patient was informed that any form of physical contact and entering another patients room are strictly prohibited. For safety, it was explained that all care areas are designed with safety features and are continuously monitored by security cameras. Phone times and visiting hours were reviewed. Patient showed no signs of learning as he has baseline developmental delay.  Past Medical History:  Diagnosis Date   Anxiety    Developmental delay disorder    Malignant neoplasm of thyroid  gland ([facility]) 02/2014   Per neurology MD MR notes

## 2024-11-09 NOTE — Consult Note (Cosign Needed)
 [facility] Psychiatric Consult Initial  Patient Name: .Ivan Winters  MRN: [id number]  DOB: April 06, 1956  Consult Order details:  Orders (From admission, onward)     Start     Ordered   11/09/24 2345  CONSULT TO CALL ACT TEAM       Ordering Provider: Jacolyn Pae, MD  Provider:  (Not yet assigned)  Question:  Reason for Consult?  Answer:  Psych consult   11/09/24 2344   11/09/24 2345  IP CONSULT TO PSYCHIATRY       Ordering Provider: Jacolyn Pae, MD  Provider:  (Not yet assigned)  Question Answer Comment  Reason for consult: Other (see comments)   Comments: Psych eval      11/09/24 2344             Mode of Visit: Tele-visit Virtual Statement:TELE PSYCHIATRY ATTESTATION & CONSENT As the provider for this telehealth consult, I attest that I verified the patient's identity using two separate identifiers, introduced myself to the patient, provided my credentials, disclosed my location, and performed this encounter via a HIPAA-compliant, real-time, face-to-face, two-way, interactive audio and video platform and with the full consent and agreement of the patient (or guardian as applicable.) Patient physical location: [facility]. Telehealth provider physical location: home office in state of [place] .   Video start time:   Video end time:      Psychiatry Consult Evaluation  Service Date: November 09, 2024 LOS:  LOS: 0 days  Chief Complaint Psych Evaluation  Primary Psychiatric Diagnoses  Intellectual disability  Assessment  Ivan Winters is a 69 y.o. male admitted: Presented to the ED 11/09/2024 10:08 PM for psychiatric evaluation and need for safe discharge planning following same-day hospital discharge. He carries no clearly documented psychiatric diagnoses at this time.  His current presentation of confusion regarding reason for hospitalization and inability to safely remain alone in the community is most consistent with cognitive  impairment and psychosocial stressors related to living situation. He meets criteria for continued observation based on impaired orientation to situation, inability to independently ensure safety, lack of immediate caregiver availability, and collateral confirmation that he is no longer safe to be alone.  Current outpatient psychotropic medications include none documented or unable to be verified at this time, and historically he has had an unknown response to psychiatric medications. He was unable to demonstrate reliable compliance with outpatient follow-up prior to admission, as evidenced by return to the ED the same day as discharge and inability to articulate discharge instructions or reason for current hospitalization.  On initial examination, the patient was alert and aware he was in the hospital, but unable to state why he was present. He was calm and cooperative, with no acute agitation, no suicidal or homicidal ideation, and no evidence of hallucinations or psychosis. Insight and judgment were impaired. Collateral from his sister, who is his reported power of attorney, confirms that the patient is not safe to be left alone, and she is currently out of town but plans to return on Sunday to assume care and transport the patient. The patient will therefore remain under observation until a safe discharge to his sister can be completed.  Diagnoses:  Active Hospital problems: Active Problems:   * No active hospital problems. *    Plan   ## Psychiatric Medication Recommendations:  Hydroxyzine  25mg  tid  ## Medical Decision Making Capacity: Not specifically addressed in this encounter   ## Disposition:-- Observation until safe discharge completed.  ##  Behavioral / Environmental: - No specific recommendations at this time.     ## Safety and Observation Level:  - Based on my clinical evaluation, I estimate the patient to be at no risk of self harm in the current setting. - At this time, we  recommend  routine. This decision is based on my review of the chart including patient's history and current presentation, interview of the patient, mental status examination, and consideration of suicide risk including evaluating suicidal ideation, plan, intent, suicidal or self-harm behaviors, risk factors, and protective factors. This judgment is based on our ability to directly address suicide risk, implement suicide prevention strategies, and develop a safety plan while the patient is in the clinical setting. Please contact our team if there is a concern that risk level has changed.  CSSR Risk Category:C-SSRS RISK CATEGORY: No Risk  Suicide Risk Assessment: Patient has following modifiable risk factors for suicide: triggering events, which we are addressing by patient observation. Patient has following non-modifiable or demographic risk factors for suicide: male gender Patient has the following protective factors against suicide: Supportive family  Thank you for this consult request. Recommendations have been communicated to the primary team.  We will recommend observation at this time.   Delynn Olvera, NP       History of Present Illness  Relevant Aspects of Hospital ED Course:  Admitted on 11/09/2024 for psychiatric evaluation.   Patient Report:  The patient is a 69 year old male brought to the emergency department by EMS after calling for assistance following discharge from [facility] earlier the same day. The patient is aware that he is in the hospital but states he does not know why he is here. The patient gave permission to contact his sister for collateral information.  Per collateral from the patients sister, who is reportedly his power of attorney, she was unaware that the patient had returned to the hospital. She reports that the patient previously lived alone until approximately one year ago, when their brother moved in with him. The patient reportedly does not like the brother  living in his space and has had increasing difficulty managing at home. The sister reports concern that the patient is no longer safe to be left alone. She is currently out of town and states she will return on Sunday, at which time she plans to pick the patient up from the hospital.  The patient denies suicidal ideation, homicidal ideation, and auditory or visual hallucinations. He reports no substance use and provides limited history due to confusion regarding his current situation.  Psych ROS:  Depression: No Anxiety: Yes Mania (lifetime and current): No Psychosis: (lifetime and current): No  Review of Systems  Constitutional: Negative.   HENT: Negative.    Eyes: Negative.   Respiratory: Negative.    Cardiovascular: Negative.   Gastrointestinal: Negative.   Genitourinary: Negative.   Musculoskeletal: Negative.   Skin: Negative.   Neurological: Negative.      Psychiatric and Social History  Psychiatric History:  Information collected from chart history  Prev Dx/Sx: Intellectual disability Current Psych Provider: Not reported Home Meds (current): Not reported Previous Med Trials: Not reported Therapy: Not reported  Prior Psych Hospitalization: Unknown Prior Self Harm: Unknown Prior Violence: Unknown  Family Psych History: Not reported Family Hx suicide: Not reported  Social History:  Developmental Hx: Unknown Educational Hx: Unknown Occupational Hx: Unknown Legal Hx: Unknown Living Situation: With brother Spiritual Hx: Unknown Access to weapons/lethal means: No  Substance History Alcohol: Denies Tobacco: Denies  Illicit drugs: Denies Prescription drug abuse: Denies Rehab hx: Denies  Exam Findings   Vital Signs:  Temp:  [97.9 F (36.6 C)-98.2 F (36.8 C)] 98.2 F (36.8 C) (01/30 2156) Pulse Rate:  [84-110] 110 (01/30 2156) Resp:  [16-18] 18 (01/30 2156) BP: (126-144)/(61-89) 144/89 (01/30 2156) SpO2:  [92 %-97 %] 95 % (01/30 2156) Weight:  [65.8 kg]  65.8 kg (01/30 2154) Blood pressure (!) 144/89, pulse (!) 110, temperature 98.2 F (36.8 C), temperature source Oral, resp. rate 18, height 5' 6 (1.676 m), weight 65.8 kg, SpO2 95%. Body mass index is 23.41 kg/m.  Physical Exam HENT:     Head: Normocephalic.     Nose: Nose normal.     Mouth/Throat:     Pharynx: Oropharynx is clear.  Eyes:     Extraocular Movements: Extraocular movements intact.  Pulmonary:     Effort: Pulmonary effort is normal.  Musculoskeletal:        General: Normal range of motion.     Cervical back: Normal range of motion.  Skin:    General: Skin is dry.  Neurological:     Mental Status: He is alert.       Other History   These have been pulled in through the EMR, reviewed, and updated if appropriate.  Family History:  The patient's family history includes Graves' disease in his sister; Heart disease in his father; Hypertension in his mother; Hypothyroidism in his mother.  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  Current Outpatient Medications:    hydrOXYzine  (ATARAX ) 25 MG tablet, Take 1 tablet (25 mg total) by mouth 3 (three) times daily., Disp: 90 tablet, Rfl: 0   levothyroxine  (SYNTHROID ) 75 MCG tablet, Take 75 mcg by mouth daily., Disp: , Rfl:  [2] No Known Allergies

## 2024-11-09 NOTE — Consult Note (Signed)
 [facility] Psychiatric Consult Initial  Patient Name: .Ivan Winters  MRN: [id number]  DOB: [birth year]  Consult Order details:  Orders (From admission, onward)     Start     Ordered   11/09/24 0451  CONSULT TO CALL ACT TEAM       Ordering Provider: Keith Sor, PA-C  Provider:  (Not yet assigned)  Question:  Reason for Consult?  Answer:  delusions   11/09/24 0450             Mode of Visit: In person    Psychiatry Consult Evaluation  Service Date: November 09, 2024 LOS:  LOS: 0 days  Chief Complaint: Psych Eval.   Primary Psychiatric Diagnoses  Anxiety (unspecified) 2.   Intellectual disability 3.   Behavior concern in adult  R/O Unspecified psychosis R/O Unspecified Delusions  R/O Magical thinking due to severe intellectual disability   Assessment   Ivan Winters is a 69 y.o. Caucasian male with a past psychiatric history of severe intellectual disability and generalized anxiety, with pertinent medical comorbidities/history that also includes severe intellectual disability, history of thyroid  cancer with thyroid  removal, hiatal hernia, and recent kidney stone, who presented this encounter by way of EMS for concerns for abdominal pains x 4 days, in addition to behavior concerns in adult, who upon EDP evaluation, as a relates to behavior concern in adult, consulted psychiatry for specialty evaluation and recommendations.  Patient is medically clear, per EDP team, as well as voluntary.  Upon investigation conducted, patient presents with symptomology that is most consistent with unspecified anxiety, behavior concern in adult, and the patient's chronic illness course of intellectual disability. Evidence of this is appreciable from investigation conducted, where etiologically and diagnostically, it is unclear at this time what specifically is causing the appreciably anxiety and symptoms that the patient is experiencing that are reported by family.  Highest on the differential  is that in the context of the patient's severe limited intellectual capacity, the patient is essentially presenting with magical thinking (I.e., attempting to explain his anxious distress around his medical concerns at his level of intellectual understanding), of which when documented and appreciated by family, appears to be representative of the patient experiencing paranoid ideations, possible delusions, and possible hallucinations.  This clinical suspicion is supported during examination, where patient presents with no appreciable evidence of paranoid ideations, intoxication (uds and bal negative), overt delusional expressions, evidence of responding to internal and or external stimuli, with endorsements of responding to internal and/or external stimuli, with endorsements of depression, with evidence to suggest unspecified neurocognitive impairment (I.e., dementia, neurosyphilis), and or with evidence of possible delirium, but rather simply anxiety around his medical concerns.  That being said, still possible that the patient could be experiencing unspecified psychosis and/or unspecified delusions of some sort, though investigation to date and time has not appreciated much clinical strength toward this explanation.  From investigation conducted, of which included extensive chart review, extensive collateral obtained from the patient's family, and examination conducted by this provider, there is no evidence to suggest that the patient is an imminent risk to himself or others, or decompensated in such a manner that would warrant the recommendation for a higher level of care such as the recommendation for inpatient mental health hospitalization, thus given the patient has tremendously strong support from his family members who care for him and live with him, family reports that they can closely follow-up with outpatient resources given upon discharge, family reports that they can keep the  patient safe, safety  planning has been able to meticulously be put in place, and plan of care has been able to be strongly put in place listed below, recommendation is for psychiatric clearance.  Spoke with Dr. Merilee who is in agreement with recommendation for psychiatric clearance, as well as discussed with Dr. Towana recommendations and care measures to move forward from this consultation.  I personally spent a total of 70 minutes in the care of the patient today including preparing to see the patient, getting/reviewing separately obtained history, performing a medically appropriate exam/evaluation, counseling and educating, referring and communicating with other health care professionals, documenting clinical information in the EHR, independently interpreting results, communicating results, coordinating care, and Collateral calls and meeting with family.  Diagnoses:  Active Hospital problems: Principal Problem:   Anxiety Active Problems:   Intellectual disability   Behavior concern in adult    Plan   # Unspecified anxiety # Intellectual disability  # Behavior concern in adult  Rule out magical thinking and severe intellectual disability adult Rule out unspecified delusional disorder Rule out unspecified psychosis  ## Psychiatric Recommendations:   - Recommend close outpatient follow-up with outpatient psychiatric medication management therapy services resources given upon discharge - Recommend strict adherence to safety plan created today with family listed below - Recommend close outpatient follow-up with primary care services  Safety Plan Ivan Winters will reach out to sister and brother, call 911 or call mobile crisis, or go to nearest emergency room if condition worsens or if suicidal thoughts become active Patients' will follow up with [facility] and/or [facility] for outpatient psychiatric services (therapy/medication management).  The suicide prevention education  provided includes the following: Suicide risk factors Suicide prevention and interventions National Suicide Hotline telephone number [facility] assessment telephone number [facility] Emergency Assistance 911 [facility] and/or Residential Mobile Crisis Unit telephone number Request made of family/significant other to: POA Remove weapons (e.g., guns, rifles, knives), all items previously/currently identified as safety concern.   Remove drugs/medications (over the counter, prescriptions, illicit drugs), all items previously/currently identified as a safety concern.   ## Medical Decision Making Capacity: Patient has a guardian and has thus been adjudicated incompetent; please involve patients guardian in medical decision making  ## Further Work-up: None at this time  ## Disposition:-- There are no psychiatric contraindications to discharge at this time  ## Behavioral / Environmental: -Strict agitation/safety precautions until discharge; strict adherence to safety plan upon discharge    ## Safety and Observation Level:  - Based on my clinical evaluation, I estimate the patient to be at low risk of self harm in the current setting and upon recommendation for discharge. - At this time, we recommend  routine. This decision is based on my review of the chart including patient's history and current presentation, interview of the patient, mental status examination, and consideration of suicide risk including evaluating suicidal ideation, plan, intent, suicidal or self-harm behaviors, risk factors, and protective factors. This judgment is based on our ability to directly address suicide risk, implement suicide prevention strategies, and develop a safety plan while the patient is in the clinical setting. Please contact our team if there is a concern that risk level has changed.  CSSR Risk Category:   Suicide Risk Assessment: Patient has following modifiable risk factors for  suicide: medication noncompliance, lack of access to outpatient mental health resources, active mental illness (to encompass adhd, tbi, mania, psychosis, trauma reaction), current symptoms: anxiety/panic, insomnia, impulsivity, anhedonia,  hopelessness, triggering events, and pain, medical illness (ie new dx of cancer), which we are addressing by recommendations. Patient has following non-modifiable or demographic risk factors for suicide: male gender Patient has the following protective factors against suicide: Access to outpatient mental health care, Supportive family, Supportive friends, Frustration tolerance, no history of suicide attempts, and no history of NSSIB  Thank you for this consult request. Recommendations have been communicated to the primary team.  We will sign off at this time.   Jerel JINNY Gravely, NP     History of Present Illness   Ivan Winters is a 69 y.o. Caucasian male with a past psychiatric history of severe intellectual disability and generalized anxiety, with pertinent medical comorbidities/history that also includes severe intellectual disability, history of thyroid  cancer with thyroid  removal, hiatal hernia, and recent kidney stone, who presented this encounter by way of EMS for concerns for abdominal pains x 4 days, in addition to behavior concerns in adult, who upon EDP evaluation, as a relates to behavior concern in adult, consulted psychiatry for specialty evaluation and recommendations.  Patient is medically clear, per EDP team, as well as voluntary.  Patient seen today at the [facility] emergency department for face-to-face psychiatric evaluation.  Upon evaluation, patient capacity to participate in examination, due to severe intellectual disability, notably significantly limited, but nonetheless, patient is attentive towards this clinical research associate, and actively engages and gives strong effort to participate.  Patient is able to tell me that he was brought in because he was  anxious about his health. Prompted to expand on this, and with lots of direction and prompting by this provider, patient is able to articulate that he came in over concerns involving his toe, his abdomen, being bit by spider near his eye, possibly having cancer, and that he has had concern over penile shrinking.  Patient then after strong prompting and direction, and articulation over his aforementioned medical concerns, motions to this provider desire for examination, to which after this provider examines his eye where he states that he was possibly bit by a spider, looks at the patient's toe, palpates softly in the patient's abdomen, and gives reassurance that it is very unlikely that the patient is experiencing penile shrinking, the patient appreciably becomes meaningfully less anxious in his interpersonal style and overall presentation, appearing to be relieved and reassured.  Patient is able to tell me that he is not in any pain currently, and objectively, does not appear to be in any physical distress.  Patient objectively does not appear to be intoxicated, and denies any EtOH, drugs, and or tobacco; UDS unremarkable, BAL unremarkable.  Patient endorses to me that he does not feel paranoid, but endorses that he is anxious and that he struggles with anxiety.  Patient is able to tell me orientation questions correctly, and appreciably presents with no evidence of concern for fluctuations of consciousness.  Patient denies any auditory or visual loose nations, and objectively, does not appear to be presenting with psychotic features, overt delusional themes, and or evidence to suggest that the patient is paranoid.  Objectively at bedside, patient is appreciated to have eaten largely all of his breakfast at bedside, as well as it is reported that the patient has been hydrating adequately, and appreciably, patient presents with normal bulk and tone, and does not appear to present with evidence of reduced intake.   Upon direction and questioning, patient endorses he believes his appetite is normal, and endorses also that he feels that he is  sleeping normal.  Patient denies any suicidal and or homicidal ideations.  Patient asked about history of performing self-injurious behavior and/or suicide attempts, but despite forming the question in a variety of different ways, patient appeared to not understand this question.   Patient attempted to be asked a variety of historical questions, but appreciably struggled to largely be able to provide much of his history.  Patient asked about experiencing any possible depression, which patient was able to successfully endorse that he was not depressed.  Discussed with patient that given promising meetings held with his sister, recommendation would be for the above recommendations, and to discharge home, to which patient endorsed he was amenable to this.  Patient denied any safety concerns with returning home.  Collateral, patient's sister Ms. Apolinar, who is notably the patient's power of attorney, spoke to over the phone  Patient's sister reports that for many years the patient has struggled with anxiety around his health and generalized stressors, but that particularly over the last week, states that the patient she states has been acting very much not like himself, states that she feels that it is directly correlated to the patient having a severe kidney stone last Friday.  Expanding on the patient not acting like himself over the last week, patient sister reports that the patient has been having increased temper tantrums in the form of anger outbursts and hitting/banging things, states that the patient has been since last Saturday not eating full meals, but rather only now frequently snacking constantly throughout the day, states that the patient has been anxiously expressing concerns around possibly having gangrene of his toe, worried about having something wrong with above his  right eye due to a possible spider bite, worrying about possible penile shrinking, and expressing concerns that the television has been telling him that he might have prostate cancer.  Patient sister reports that the patient does not have largely a psychiatric history, outside of generalized excited disorder, and the patient's severe developmental delay and/or intellectual disability.  Patient sister reports that the patient lives with their brother, but that she has the power of attorney.  Patient sister additionally provides the extensive historical information listed below.  Patient sister reports that the patient she believes is sleeping appropriately.  Patient sister reports that while the patient does not largely have a psychiatric history, given the patient struggles with anxiety, states that the patient did through primary care last October trial Zoloft, but upon experiencing mild diarrhea, immediately refused to take it any longer, and has since been more resistant to trying other medications to help with his anxiety.  Discussed with family that highest on the differential is that in the context of the patient's severe limited intellectual capacity, the patient is essentially presenting with magical thinking (I.e., attempting to explain his anxious distress around his medical concerns at his level of intellectual understanding), of which when documented and appreciated by family, appears to be representative of the patient experiencing paranoid ideations, possible delusions, and possible hallucinations.  Discussed however nonetheless, the above clinical suspicions could also be possible, and that given investigation conducted, the above recommendations would be strongly encouraged, to which family endorsed that they were amenable to plan of care to move forward.  Review of Systems  Constitutional:  Negative for malaise/fatigue.  Cardiovascular:  Negative for chest pain.  Gastrointestinal:   Negative for abdominal pain.  Musculoskeletal:        Denies pain in general   Psychiatric/Behavioral:  Negative for depression,  hallucinations, substance abuse and suicidal ideas. The patient is nervous/anxious. The patient does not have insomnia.   All other systems reviewed and are negative.    Psychiatric and Social History  Psychiatric History:  Information collected from chart review/family/patient  Prev Dx/Sx: Generalized anxiety Current Psych Provider: None Home Meds (current): None Previous Med Trials: Zoloft (gave patient diarrhea, immediately discontinued it, given for anxiety by primary care around October) Therapy: None  Prior Psych Hospitalization: None Prior Self Harm: None Prior Violence: Family reports patient does have temper tantrums  Family Psych History: Family reports mother was hypochondriac Family Hx suicide: None reported  Social History:  Developmental Hx: Severe developmental delay and intellectual disability Educational Hx: None reported Occupational Hx: On disability Legal Hx: None Living Situation: Lives with brother; sister POA Spiritual Hx: None reported Access to weapons/lethal means: None reported  Substance History Alcohol: None reported  Tobacco: None reported Illicit drugs: None reported Prescription drug abuse: None reported Rehab hx: None reported  Exam Findings  Physical Exam: As below Vital Signs:  Temp:  [97.9 F (36.6 C)-98.5 F (36.9 C)] 97.9 F (36.6 C) (01/30 0418) Pulse Rate:  [87-102] 87 (01/30 0418) Resp:  [16-17] 16 (01/30 0418) BP: (126-164)/(61-99) 126/61 (01/30 0418) SpO2:  [92 %-99 %] 92 % (01/30 0418) Weight:  [65.8 kg] 65.8 kg (01/29 2120) Blood pressure 126/61, pulse 87, temperature 97.9 F (36.6 C), resp. rate 16, height 5' 6 (1.676 m), weight 65.8 kg, SpO2 92%. Body mass index is 23.41 kg/m.  Physical Exam Vitals and nursing note reviewed.  Constitutional:      General: He is not in acute  distress.    Appearance: He is well-developed and normal weight. He is not ill-appearing, toxic-appearing or diaphoretic.     Comments: Regressed interpersonal style with anxious edge   Pulmonary:     Effort: Pulmonary effort is normal.  Skin:    General: Skin is warm and dry.  Neurological:     Mental Status: He is alert and oriented to person, place, and time.  Psychiatric:        Attention and Perception: Attention and perception normal.        Behavior: Behavior is not agitated, slowed, aggressive, withdrawn, hyperactive or combative. Behavior is cooperative.        Thought Content: Thought content is not paranoid or delusional. Thought content does not include homicidal or suicidal ideation.     Comments: Mood: okay Affect: Neutral with regressed edge Speech: Mumbled Behavior: Cooperative Judgment: Chronically impaired Cognition and memory: Chronically impaired, poor Thought content: Anxious      Mental Status Exam: General Appearance: Normal bulk and tone Caucasian male in casual clothing with mildly under Hygiene and grooming with regressed interpersonal style  Orientation:  Full (Time, Place, and Person)  Memory:  Poor  Concentration:  Concentration: Fair and Attention Span: Fair  Recall:  Poor  Attention  Fair  Eye Contact:  Variable to fair  Speech:  Garbled  Language:  Poor  Volume:  Decreased  Mood: Okay  Affect:  Neutral with regressed and anxious edge  Thought Process: Concrete, regressed, short  Thought Content: Anxious  Suicidal Thoughts:  No  Homicidal Thoughts:  No  Judgement:  Impaired, chronically  Insight:  Lacking  Psychomotor Activity:  Normal  Akathisia:  No  Fund of Knowledge:  Impaired chronically      Assets:  Communication Skills Desire for Improvement Financial Resources/Insurance Housing Intimacy Leisure Time Physical Health Resilience Social Support Transportation  Cognition:  Impaired,  Severe  ADL's:  Impaired  AIMS (if  indicated):   0     Other History   These have been pulled in through the EMR, reviewed, and updated if appropriate.  Family History:  The patient's family history includes Graves' disease in his sister; Heart disease in his father; Hypertension in his mother; Hypothyroidism in his mother.  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

## 2024-11-09 NOTE — ED Provider Notes (Signed)
 " [facility] Provider Note   CSN: [id number] Arrival date & time: 11/08/24  2113     Patient presents with: Chest Pain and Abdominal Pain   Ivan Winters is a 69 y.o. male.   69 year old male with a history of thyroid  dysfunction, anxiety, and developmental delay presents to the emergency department via EMS.  He has complaints of lower abdominal pain.  He has not had any vomiting or diarrhea.  Denies taking any medications for his symptoms.  Abdominal surgical history notable for inguinal hernia repair in 1965.  The history is provided by the patient, the EMS personnel and medical records. No language interpreter was used.  Chest Pain Associated symptoms: abdominal pain   Abdominal Pain Associated symptoms: chest pain        Prior to Admission medications  Medication Sig Start Date End Date Taking? Authorizing Provider  levothyroxine  (SYNTHROID ) 75 MCG tablet Take 75 mcg by mouth daily. 09/03/24  Yes [provider]  omeprazole (PRILOSEC) 20 MG capsule Take 20 mg by mouth daily. 10/25/24  Yes [provider]  atorvastatin  (LIPITOR) 20 MG tablet Take 20 mg by mouth daily.    [provider]  fexofenadine  (ALLEGRA ) 180 MG tablet Take 1 tablet (180 mg total) by mouth daily. 03/12/16   Gasper Devere ORN, PA-C    Allergies: Patient has no known allergies.    Review of Systems  Cardiovascular:  Positive for chest pain.  Gastrointestinal:  Positive for abdominal pain.  Ten systems reviewed and are negative for acute change, except as noted in the HPI.    Updated Vital Signs BP 126/61   Pulse 87   Temp 97.9 F (36.6 C)   Resp 16   Ht 5' 6 (1.676 m)   Wt 65.8 kg   SpO2 92%   BMI 23.41 kg/m   Physical Exam Vitals and nursing note reviewed.  Constitutional:      General: He is not in acute distress.    Appearance: He is well-developed. He is not diaphoretic.     Comments: Nontoxic appearing and in NAD   HENT:     Head: Normocephalic and atraumatic.  Eyes:     General: No scleral icterus.    Conjunctiva/sclera: Conjunctivae normal.  Cardiovascular:     Rate and Rhythm: Normal rate and regular rhythm.     Pulses: Normal pulses.  Pulmonary:     Effort: Pulmonary effort is normal. No respiratory distress.     Comments: Respirations even and unlabored Abdominal:     Palpations: Abdomen is soft. There is no mass.     Tenderness: There is no abdominal tenderness. There is no guarding.     Comments: Abdominal exam benign. No focal TTP, guarding.  Musculoskeletal:        General: Normal range of motion.     Cervical back: Normal range of motion.  Skin:    General: Skin is warm and dry.     Coloration: Skin is not pale.     Findings: No erythema or rash.  Neurological:     General: No focal deficit present.     Mental Status: He is alert.     Coordination: Coordination normal.  Psychiatric:        Mood and Affect: Mood is anxious.        Behavior: Behavior is cooperative.     (all labs ordered are listed, but only abnormal results are displayed) Labs Reviewed  CBC -  Abnormal; Notable for the following components:      Result Value   RBC 4.02 (*)    HCT 37.6 (*)    All other components within normal limits  COMPREHENSIVE METABOLIC PANEL WITH GFR - Abnormal; Notable for the following components:   Potassium 3.2 (*)    Chloride 97 (*)    Glucose, Bld 115 (*)    All other components within normal limits  URINALYSIS, ROUTINE W REFLEX MICROSCOPIC - Abnormal; Notable for the following components:   Hgb urine dipstick MODERATE (*)    Ketones, ur 5 (*)    All other components within normal limits  LIPASE, BLOOD  TSH  URINE DRUG SCREEN  SYPHILIS: RPR W/REFLEX TO RPR TITER AND TREPONEMAL ANTIBODIES, TRADITIONAL SCREENING AND DIAGNOSIS ALGORITHM  HIV ANTIBODY (ROUTINE TESTING W REFLEX)  TROPONIN T, HIGH SENSITIVITY  TROPONIN T, HIGH SENSITIVITY    EKG: EKG  Interpretation Date/Time:  Thursday November 08 2024 22:12:39 EST Ventricular Rate:  99 PR Interval:  132 QRS Duration:  86 QT Interval:  332 QTC Calculation: 426 R Axis:   74  Text Interpretation: Normal sinus rhythm Normal ECG No previous ECGs available Interpretation limited secondary to artifact Confirmed by Midge Golas ([id number]) on 11/08/2024 11:23:09 PM  Radiology: CT Head Wo Contrast Result Date: 11/09/2024 EXAM: CT HEAD WITHOUT CONTRAST 11/08/2024 11:52:00 PM TECHNIQUE: CT of the head was performed without the administration of intravenous contrast. Automated exposure control, iterative reconstruction, and/or weight based adjustment of the mA/kV was utilized to reduce the radiation dose to as low as reasonably achievable. COMPARISON: None available. CLINICAL HISTORY: Mental status change, unknown cause. FINDINGS: BRAIN AND VENTRICLES: No acute hemorrhage. No evidence of acute infarct. No hydrocephalus. No extra-axial collection. No mass effect or midline shift. ORBITS: No acute abnormality. SINUSES: No acute abnormality. SOFT TISSUES AND SKULL: No acute soft tissue abnormality. No skull fracture. IMPRESSION: 1. No acute intracranial abnormality. Electronically signed by: Dorethia Molt MD 11/09/2024 12:18 AM EST RP Workstation: [id number]   DG Chest Port 1 View Result Date: 11/08/2024 EXAM: 1 VIEW XRAY OF THE CHEST 11/08/2024 10:27:00 PM COMPARISON: CT renal 11/07/2024. CLINICAL HISTORY: Query pain. FINDINGS: LUNGS AND PLEURA: No focal pulmonary opacity. No pleural effusion. No pneumothorax. HEART AND MEDIASTINUM: No acute abnormality of the cardiac and mediastinal silhouettes. BONES AND SOFT TISSUES: No acute osseous abnormality. IMPRESSION: 1. No acute findings. Electronically signed by: Morgane Naveau MD 11/08/2024 10:33 PM EST RP Workstation: [id number]     Procedures   Medications Ordered in the ED  atorvastatin  (LIPITOR) tablet 20 mg (has no administration in time range)   potassium chloride  SA (KLOR-CON  M) CR tablet 40 mEq (40 mEq Oral Given 11/09/24 0023)  naproxen  (NAPROSYN ) tablet 500 mg (500 mg Oral Given 11/09/24 0023)    Clinical Course as of 11/09/24 0656  Thu Nov 08, 2024  2343 Spoke with Apolinar, sister. Patient has been staying with his brother, but sister is medical POA.   States that last week or so patient has been paranoid. Apolinar believes patient passed a kidney stone on Friday (11/02/24). After that, patient has been exceedingly paranoid; thought penis was shrinking, that a spider bit him, he had gangrene of his toe, that there was something in his eye. Also thought the TV was talking to him; telling him he had prostate cancer.  Has had the same pants on x 3 months and will not take them off. Patient has not had a full meal since Saturday; will snack on and  off.   Has been having angry outbursts and been hitting/banging on things, per sister.  Not on any psychiatric medications. No recent medication changes. Has a hx of paying for sex; sister questions STI causing symptoms today. No fevers, diarrhea, vomiting.  [KH]  Fri Nov 09, 2024  0103 Reviewed negative CT renal stone study from 11/06/2024.  Given stable labs and benign abdominal exam, do not feel further abdominal imaging is presently warranted. [KH]  [phone] Spoke with Dr. Vanessa of neurology. Have reviewed case and presenting complaint. Dr. Khaliqdina does not see indication for admission for further medical evaluation. He has viewed the patient's CT which does not appear to show signs of frontotemporal dementia. Would recommend proceeding with TTS assessment. [KH]  [id number] TTS consult placed. Patient medically cleared. [KH]    Clinical Course User Index [KH] Keith Sor, PA-C                                 Medical Decision Making Amount and/or Complexity of Data Reviewed Labs: ordered.  Risk Prescription drug management.   This patient presents to the ED for concern of abdominal  pain, this involves an extensive number of treatment options, and is a complaint that carries with it a high risk of complications and morbidity.  The differential diagnosis includes constipation vs pSBO vs SBO vs pancreatitis vs biliary pathology vs viral illness vs abdominal mass.   Co morbidities that complicate the patient evaluation  Thyroid  dysfunction Anxiety Developmental delay   Additional history obtained:  Additional history obtained from sister, via phone External records from outside source obtained and reviewed including CT renal stone study at [facility] on 11/07/24 which was negative.   Lab Tests:  I Ordered, and personally interpreted labs.  The pertinent results include:  K 3.2. Otherwise, labs WNL.   Imaging Studies ordered:  I ordered imaging studies including CT head and CXR  I independently visualized and interpreted imaging which showed no acute pathology I agree with the radiologist interpretation   Cardiac Monitoring:  The patient was maintained on a cardiac monitor.  I personally viewed and interpreted the cardiac monitored which showed an underlying rhythm of: NSR   Medicines ordered and prescription drug management:  I ordered medication including Naproxen  for pain  Reevaluation of the patient after these medicines showed that the patient stayed the same I have reviewed the patients home medicines and have made adjustments as needed   Test Considered:  Ethanol   Consultations Obtained:  I requested consultation with TTS  - consultation pending.   Problem List / ED Course:  As above   Reevaluation:  After the interventions noted above, I reevaluated the patient and found that they have :stayed the same   Social Determinants of Health:  Intellectual disability/developmental delay   Dispostion:  Care signed out to Stokes, PA-C at shift change.      Final diagnoses:  Paranoid delusion [facility])    ED Discharge Orders     None           Keith Sor, PA-C 11/09/24 0701    Midge Golas, MD 11/09/24 [phone]  "

## 2024-11-09 NOTE — ED Triage Notes (Signed)
 [facility] today and told to follow up with [facility] or [facility]. EMS called [facility] and was told that if pt complained of anything they wouldn't accept him. At the time of call pt reported cp and abd pain to them. Pt denies such now. Pt POA is reportedly his sister and EMS was unable to contact. Pt lives with his brother who wasn't present at time of pt calling EMS.   EMS VS: 140/palp HR 110 98% RA  Pt now reporting some chest pain.  EMS reports concern for pt returning home due to safety. Pt wandered aimlessly from EMS multiple times and had to be redirected. Concern for pt eloping. Hx of mental incapacity.   Pt ambulatory. Alert and at apparent baseline. Following commands. Breathing unlabored with symmetric chest rise and fall.

## 2024-11-09 NOTE — ED Provider Notes (Signed)
 Emergency Medicine Observation Re-evaluation Note  Ivan Winters is a 69 y.o. male, seen on rounds today.  Pt initially presented to the ED for complaints of Chest Pain and Abdominal Pain Currently, the patient is sitting quietly.  Physical Exam  BP 126/61   Pulse 87   Temp 97.9 F (36.6 C)   Resp 16   Ht 5' 6 (1.676 m)   Wt 65.8 kg   SpO2 92%   BMI 23.41 kg/m  Physical Exam General: No acute distress Cardiac: Perfused Lungs: Nonlabored Psych: Calm  ED Course / MDM  EKG:EKG Interpretation Date/Time:  Thursday November 08 2024 22:12:39 EST Ventricular Rate:  99 PR Interval:  132 QRS Duration:  86 QT Interval:  332 QTC Calculation: 426 R Axis:   74  Text Interpretation: Normal sinus rhythm Normal ECG No previous ECGs available Interpretation limited secondary to artifact Confirmed by Midge Golas ([id number]) on 11/08/2024 11:23:09 PM  I have reviewed the labs performed to date as well as medications administered while in observation.  Recent changes in the last 24 hours include medical screening and psychiatric evaluation.  Plan  Current plan is for psychiatry feels patient can return home.  They have discussed this with family and have made further recommendations to family.Ivan Towana Ozell JAYSON, MD 11/09/24 [phone]

## 2024-11-09 NOTE — Consult Note (Incomplete)
 [facility] Psychiatric Consult Initial  Patient Name: .Ivan Winters  MRN: [id number]  DOB: [birth year]  Consult Order details:  Orders (From admission, onward)     Start     Ordered   11/09/24 2345  CONSULT TO CALL ACT TEAM       Ordering Provider: Jacolyn Pae, MD  Provider:  (Not yet assigned)  Question:  Reason for Consult?  Answer:  Psych consult   11/09/24 2344   11/09/24 2345  IP CONSULT TO PSYCHIATRY       Ordering Provider: Jacolyn Pae, MD  Provider:  (Not yet assigned)  Question Answer Comment  Reason for consult: Other (see comments)   Comments: Psych eval      11/09/24 2344             Mode of Visit: Tele-visit Virtual Statement:TELE PSYCHIATRY ATTESTATION & CONSENT As the provider for this telehealth consult, I attest that I verified the patient's identity using two separate identifiers, introduced myself to the patient, provided my credentials, disclosed my location, and performed this encounter via a HIPAA-compliant, real-time, face-to-face, two-way, interactive audio and video platform and with the full consent and agreement of the patient (or guardian as applicable.) Patient physical location: [facility]. Telehealth provider physical location: home office in state of [place] .   Video start time:   Video end time:      Psychiatry Consult Evaluation  Service Date: November 09, 2024 LOS:  LOS: 0 days  Chief Complaint ***  Primary Psychiatric Diagnoses  *** 2.  *** 3.  ***  Assessment  Ivan Winters is a 69 y.o. male admitted: Presented to the EDfor 11/09/2024 10:08 PM for ***. He carries the psychiatric diagnoses of *** and has a past medical history of  ***.   His current presentation of *** is most consistent with ***. He meets criteria for *** based on ***.  Current outpatient psychotropic medications include *** and historically he has had a *** response to these medications. He was *** compliant with medications  prior to admission as evidenced by ***. On initial examination, patient ***. Please see plan below for detailed recommendations.   Diagnoses:  Active Hospital problems: Active Problems:   * No active hospital problems. *    Plan   ## Psychiatric Medication Recommendations:  ***  ## Medical Decision Making Capacity: {CHL BH MEDICAL DECISION MAKING CAPACITY:31818}  ## Further Work-up:  -- *** {CHLmacgeneralandspecificworkuprecs:31821} -- most recent EKG on *** had QtC of *** -- Pertinent labwork reviewed earlier this admission includes: ***   ## Disposition:-- {CHLmaccldispo:31820}  ## Behavioral / Environmental: -{CHLmacbehavioralenvironmental2:31847}    ## Safety and Observation Level:  - Based on my clinical evaluation, I estimate the patient to be at *** risk of self harm in the current setting. - At this time, we recommend  {CHL BH SUICIDE OBSERVATION LEVEL:31850}. This decision is based on my review of the chart including patient's history and current presentation, interview of the patient, mental status examination, and consideration of suicide risk including evaluating suicidal ideation, plan, intent, suicidal or self-harm behaviors, risk factors, and protective factors. This judgment is based on our ability to directly address suicide risk, implement suicide prevention strategies, and develop a safety plan while the patient is in the clinical setting. Please contact our team if there is a concern that risk level has changed.  CSSR Risk Category:C-SSRS RISK CATEGORY: No Risk  Suicide Risk Assessment: Patient has following modifiable risk factors for suicide: {  CHLmacmodifiablesuicideriskfactors:31822}, which we are addressing by ***. Patient has following non-modifiable or demographic risk factors for suicide: {CHLmacnonmodifiablesuicideriskfactors:31823} Patient has the following protective factors against suicide: {CHLmacprotectivefactors:31824}  Thank you for this  consult request. Recommendations have been communicated to the primary team.  We will *** at this time.   Errika Narvaiz, NP       History of Present Illness  Relevant Aspects of Hospital [facility] or ED course:31819} Course:  Admitted on 11/09/2024 for ***. They ***.   Patient Report:  ***  Psych ROS:  Depression: *** Anxiety:  *** Mania (lifetime and current): *** Psychosis: (lifetime and current): ***  Collateral information:  Contacted *** at *** on ***  Review of Systems  Constitutional: Negative.   Skin: Negative.      Psychiatric and Social History  Psychiatric History:  Information collected from ***  Prev Dx/Sx: *** Current Psych Provider: *** Home Meds (current): *** Previous Med Trials: *** Therapy: ***  Prior Psych Hospitalization: ***  Prior Self Harm: *** Prior Violence: ***  Family Psych History: *** Family Hx suicide: ***  Social History:  Developmental Hx: *** Educational Hx: *** Occupational Hx: *** Legal Hx: *** Living Situation: *** Spiritual Hx: *** Access to weapons/lethal means: ***   Substance History Alcohol: ***  Type of alcohol *** Last Drink *** Number of drinks per day *** History of alcohol withdrawal seizures *** History of DT's *** Tobacco: *** Illicit drugs: *** Prescription drug abuse: *** Rehab hx: ***  Exam Findings  Physical Exam: *** Vital Signs:  Temp:  [97.9 F (36.6 C)-98.2 F (36.8 C)] 98.2 F (36.8 C) (01/30 2156) Pulse Rate:  [84-110] 110 (01/30 2156) Resp:  [16-18] 18 (01/30 2156) BP: (126-144)/(61-89) 144/89 (01/30 2156) SpO2:  [92 %-97 %] 95 % (01/30 2156) Weight:  [65.8 kg] 65.8 kg (01/30 2154) Blood pressure (!) 144/89, pulse (!) 110, temperature 98.2 F (36.8 C), temperature source Oral, resp. rate 18, height 5' 6 (1.676 m), weight 65.8 kg, SpO2 95%. Body mass index is 23.41 kg/m.  Physical Exam  Mental Status Exam: General Appearance: {Appearance:22683}  Orientation:  {BHH  ORIENTATION (PAA):22689}  Memory:  {BHH MEMORY:22881}  Concentration:  {Concentration:21399}  Recall:  {BHH GOOD/FAIR/POOR:22877}  Attention  {BH Attention Span:31825}  Eye Contact:  {BHH EYE CONTACT:22684}  Speech:  {Speech:22685}  Language:  {BHH GOOD/FAIR/POOR:22877}  Volume:  {Volume (PAA):22686}  Mood: ***  Affect:  {Affect (PAA):22687}  Thought Process:  {Thought Process (PAA):22688}  Thought Content:  {Thought Content:22690}  Suicidal Thoughts:  {ST/HT (PAA):22692}  Homicidal Thoughts:  {ST/HT (PAA):22692}  Judgement:  {Judgement (PAA):22694}  Insight:  {Insight (PAA):22695}  Psychomotor Activity:  {Psychomotor (PAA):22696}  Akathisia:  {BHH YES OR NO:22294}  Fund of Knowledge:  {BHH GOOD/FAIR/POOR:22877}      Assets:  {Assets (PAA):22698}  Cognition:  {chl bhh cognition:304700322}  ADL's:  {BHH JIO'D:77709}  AIMS (if indicated):        Other History   These have been pulled in through the EMR, reviewed, and updated if appropriate.  Family History:  The patient's family history includes Graves' disease in his sister; Heart disease in his father; Hypertension in his mother; Hypothyroidism in his mother.  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  Current Outpatient Medications:    hydrOXYzine  (ATARAX ) 25 MG tablet, Take 1 tablet (25 mg total) by mouth 3 (three) times daily., Disp: 90 tablet, Rfl: 0   levothyroxine  (SYNTHROID ) 75 MCG tablet, Take 75 mcg by mouth daily.,  Disp: , Rfl:  [2] No Known Allergies

## 2024-11-09 NOTE — Discharge Instructions (Addendum)
 Please strictly adhere to safety plan created today with your family listed below Please follow-up closely with [facility] at [phone]) and/or the [facility] behavioral health center outpatient office that is located on the second floor-phone number is listed on the resource pamphlet given upon discharge Please follow-up closely with primary care services  Safety Plan Ivan Winters will reach out to sister and brother, call 911 or call mobile crisis, or go to nearest emergency room if condition worsens or if suicidal thoughts become active Patients' will follow up with [facility] and/or [facility] for outpatient psychiatric services (therapy/medication management).  The suicide prevention education provided includes the following: Suicide risk factors Suicide prevention and interventions National Suicide Hotline telephone number [facility] assessment telephone number [facility] Emergency Assistance 911 [facility] and/or Residential Mobile Crisis Unit telephone number Request made of family/significant other to: POA Remove weapons (e.g., guns, rifles, knives), all items previously/currently identified as safety concern.   Remove drugs/medications (over the counter, prescriptions, illicit drugs), all items previously/currently identified as a safety concern.

## 2024-11-09 NOTE — ED Notes (Signed)
 Patient changed out into hospital safe scrubs at this time. This RN and EDT Brittany as witness. Belongings placed in locker. Pt belongings as follows:  1 pair jean 1 grey shirt 1 flannel shirt 1 pair shoes 1 pair socks 1 black jacket 1 pair underwear

## 2024-11-09 NOTE — ED Notes (Signed)
 TTS attempted for patient. Patient hard of hearing and developmentally delayed. TTS advised that an in-person psych consult would be the best option for this pt.

## 2024-11-09 NOTE — BH Assessment (Signed)
 Clinician messaged [name], RN: Hey. It's Trey with TTS. Is the pt able to engage in the assessment, if so the pt will need to be placed in a private room. Is the pt under IVC? Also is the pt medically cleared?  Clinician is awaiting a response.    Ivan JONETTA Broach, MS, [facility], [facility] Triage Specialist [phone]

## 2024-11-09 NOTE — ED Notes (Signed)
 Spoke with pt's sister Apolinar (emergency contact) states she will have a family friend Sharman Arts come pick him up.

## 2024-11-10 LAB — SYPHILIS: RPR W/REFLEX TO RPR TITER AND TREPONEMAL ANTIBODIES, TRADITIONAL SCREENING AND DIAGNOSIS ALGORITHM: RPR Ser Ql: NONREACTIVE

## 2024-11-10 MED ORDER — HYDROXYZINE HCL 25 MG PO TABS
25.0000 mg | ORAL_TABLET | Freq: Three times a day (TID) | ORAL | Status: DC
  Administered 2024-11-10: 25 mg via ORAL
  Filled 2024-11-10: qty 1

## 2024-11-10 MED ORDER — LEVOTHYROXINE SODIUM 75 MCG PO TABS
75.0000 ug | ORAL_TABLET | Freq: Every day | ORAL | Status: DC
  Filled 2024-11-10: qty 1

## 2024-11-10 NOTE — Discharge Instructions (Signed)
Cleared by psychiatry for discharge home. 

## 2024-11-10 NOTE — ED Notes (Signed)
 Spoke with sister, [name] [phone], and she states that she is currently en route to pick up patient and is driving through [place] now. Provider notified to assign disposition. Pt resting in room with RR even and unlabored. No acute needs at this time.

## 2024-11-10 NOTE — ED Notes (Signed)
 RN attempted to obtain remaining blood draw, pt refused.

## 2024-11-10 NOTE — ED Provider Notes (Signed)
 I was told that family was coming to get pt.  D/w tracey from psych and pt was cleared by pscyhiatry.     Ernest Ronal BRAVO, MD 11/10/24 1030

## 2024-11-10 NOTE — ED Notes (Signed)
 Pt moved to BHU 8 after going into another pts room x2. Pt was told to remain in his own room but is not redirectable at this time.

## 2024-11-10 NOTE — BH Assessment (Signed)
 Comprehensive Clinical Assessment (CCA) Note  11/10/2024 Ivan Winters [id number]  Chief Complaint: Patient is a 69 year old male presenting to [facility] ED voluntarily. Per triage note [facility] and [facility] or [facility]. EMS called [facility] and was told that if pt complained of anything they wouldn't accept him. At the time of call pt reported cp and abd pain to them. Pt denies such now. Pt POA is reportedly his sister and EMS was unable to contact. Pt lives with his brother who wasn't present at time of pt calling EMS. EMS reports concern for pt returning home due to safety. Pt wandered aimlessly from EMS multiple times and had to be redirected. Concern for pt eloping. Hx of mental incapacity.  During assessment patient appears alert and oriented x3, cooperative but anxious. Patient is able to report where he is and that he lives with family, but has a more difficult time understanding why he is in the ED. 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 Patient's POA is his sister Ivan Winters who is currently 2 hours away. Patient was just discharged from [facility] on 11/09/24 that afternoon, he was seen by Psychiatry and psyc cleared, Psyc NP recommends the family to follow-up with outpatient. Patient's sister had the patient picked up by a family friend but when the patient arrived home he was calling EMS. Patient's brother was not in the home while the patient was calling EMS and EMS did not feel comfortable leaving the patient home alone due to the patient wandering. Patient is able to deny current SI/HI.  Collateral information was obtained by the patient's sister Ivan Winters [phone] who was not aware that the patient had returned back to the ED, she reports that nothing is wrong with him, and she wasn't aware that the patient was home alone. Sister does report that the patient has not been happy with his  brother living there in the home and he's just been saying some crazy things like he has cancer. Sister reports that she is currently 2 hours away and wants to pick the patient up but is unsure if she will be able to due to the impending winter weather. Sister reports that she will figure out a plan for a safe discharge for the patient. Psyc NP Jon suggests patient to be observed until a safe discharge is implemented.  Chief Complaint  Patient presents with   Psychiatric Evaluation   Visit Diagnosis: IDD    CCA Screening, Triage and Referral (STR)  Patient Reported Information How did you hear about us ? Other (Comment)  Referral name: No data recorded Referral phone number: No data recorded  Whom do you see for routine medical problems? No data recorded Practice/Facility Name: No data recorded Practice/Facility Phone Number: No data recorded Name of Contact: No data recorded Contact Number: No data recorded Contact Fax Number: No data recorded Prescriber Name: No data recorded Prescriber Address (if known): No data recorded  What Is the Reason for Your Visit/Call Today? [facility] and [facility] or [facility]. EMS called [facility] and was told that if pt complained of anything they wouldn't accept him. At the time of call pt reported cp and abd pain to them. Pt denies such now. Pt POA is reportedly his sister and EMS was unable to contact. Pt lives with his brother who  wasn't present at time of pt calling EMS.  How Long Has This Been Causing You Problems? > than 6 months  What Do You Feel Would Help You the Most Today? No data recorded  Have You Recently Been in Any Inpatient Treatment (Hospital/Detox/Crisis Center/28-Day Program)? No data recorded Name/Location of Program/Hospital:No data recorded How Long Were You There? No data recorded When Were You Discharged? No data recorded  Have You Ever Received  Services From [facility] Before? No data recorded Who Do You See at [facility]? No data recorded  Have You Recently Had Any Thoughts About Hurting Yourself? No  Are You Planning to Commit Suicide/Harm Yourself At This time? No   Have you Recently Had Thoughts About Hurting Someone [place]? No  Explanation: No data recorded  Have You Used Any Alcohol or Drugs in the Past 24 Hours? No  How Long Ago Did You Use Drugs or Alcohol? No data recorded What Did You Use and How Much? No data recorded  Do You Currently Have a Therapist/Psychiatrist? No  Name of Therapist/Psychiatrist: No data recorded  Have You Been Recently Discharged From Any Office Practice or Programs? No  Explanation of Discharge From Practice/Program: No data recorded    CCA Screening Triage Referral Assessment Type of Contact: Face-to-Face  Is this Initial or Reassessment? No data recorded Date Telepsych consult ordered in [facility]:  No data recorded Time Telepsych consult ordered in [facility]:  No data recorded  Patient Reported Information Reviewed? No data recorded Patient Left Without Being Seen? No data recorded Reason for Not Completing Assessment: No data recorded  Collateral Involvement: No data recorded  Does Patient Have a Court Appointed Legal Guardian? No data recorded Name and Contact of Legal Guardian: No data recorded If Minor and Not Living with Parent(s), Who has Custody? No data recorded Is CPS involved or ever been involved? Never  Is APS involved or ever been involved? Never   Patient Determined To Be At Risk for Harm To Self or Others Based on Review of Patient Reported Information or Presenting Complaint? No  Method: No data recorded Availability of Means: No data recorded Intent: No data recorded Notification Required: No data recorded Additional Information for Danger to Others Potential: No data recorded Additional Comments for Danger to Others Potential: No data recorded Are There Guns  or Other Weapons in Your Home? No  Types of Guns/Weapons: No data recorded Are These Weapons Safely Secured?                            No data recorded Who Could Verify You Are Able To Have These Secured: No data recorded Do You Have any Outstanding Charges, Pending Court Dates, Parole/Probation? No data recorded Contacted To Inform of Risk of Harm To Self or Others: No data recorded  Location of Assessment: [facility] ED   Does Patient Present under Involuntary Commitment? No  IVC Papers Initial File Date: No data recorded  [place] of Residence: No data recorded  Patient Currently Receiving the Following Services: No data recorded  Determination of Need: Emergent (2 hours)   Options For Referral: No data recorded    CCA Biopsychosocial Intake/Chief Complaint:  No data recorded Current Symptoms/Problems: No data recorded  Patient Reported Schizophrenia/Schizoaffective Diagnosis in Past: No   Strengths: Patient has the support of his family  Preferences: No data recorded Abilities: No data recorded  Type of Services Patient Feels are Needed: No data recorded  Initial  Clinical Notes/Concerns: No data recorded  Mental Health Symptoms Depression:  None   Duration of Depressive symptoms: No data recorded  Mania:  None   Anxiety:   Irritability; Restlessness; Worrying   Psychosis:  No data recorded  Duration of Psychotic symptoms: No data recorded  Trauma:  None   Obsessions:  None   Compulsions:  Poor Insight   Inattention:  None   Hyperactivity/Impulsivity:  None   Oppositional/Defiant Behaviors:  None   Emotional Irregularity:  Potentially harmful impulsivity   Other Mood/Personality Symptoms:  No data recorded   Mental Status Exam Appearance and self-care  Stature:  Average   Weight:  Average weight   Clothing:  Casual   Grooming:  Normal   Cosmetic use:  None   Posture/gait:  Normal   Motor activity:  Not Remarkable   Sensorium  Attention:   Normal   Concentration:  Normal   Orientation:  Person; Place; Situation   Recall/memory:  Defective in Short-term   Affect and Mood  Affect:  Anxious   Mood:  Anxious   Relating  Eye contact:  Normal   Facial expression:  Responsive   Attitude toward examiner:  Cooperative   Thought and Language  Speech flow: Articulation error   Thought content:  Appropriate to Mood and Circumstances   Preoccupation:  None   Hallucinations:  None   Organization:  No data recorded  [employer] of Knowledge:  Impoverished by (Comment); Poor   Intelligence:  Needs investigation   Abstraction:  Functional   Judgement:  Poor   Reality Testing:  Unaware   Insight:  Lacking; Poor   Decision Making:  Impulsive   Social Functioning  Social Maturity:  Impulsive   Social Judgement:  Heedless   Stress  Stressors:  Family conflict; Other (Comment)   Coping Ability:  Exhausted   Skill Deficits:  Intellect/education; Decision making; Communication; Responsibility   Supports:  Family     Religion: Religion/Spirituality Are You A Religious Person?: No  Leisure/Recreation: Leisure / Recreation Do You Have Hobbies?: No  Exercise/Diet: Exercise/Diet Do You Exercise?: No Have You Gained or Lost A Significant Amount of Weight in the Past Six Months?: No Do You Follow a Special Diet?: No Do You Have Any Trouble Sleeping?: No   CCA Employment/Education Employment/Work Situation: Employment / Work Systems Developer: On disability Why is Patient on Disability: IDD How Long has Patient Been on Disability: Unknown Has Patient ever Been in the [employer]?: No  Education: Education Is Patient Currently Attending School?: No Did You Have An Individualized Education Program (IIEP): No Did You Have Any Difficulty At [employer]?: No Patient's Education Has Been Impacted by Current Illness: No   CCA Family/Childhood History Family and Relationship  History: Family history Marital status: Single Does patient have children?: No  Childhood History:  Childhood History Did patient suffer any verbal/emotional/physical/sexual abuse as a child?: No Did patient suffer from severe childhood neglect?: No Has patient ever been sexually abused/assaulted/raped as an adolescent or adult?: No Was the patient ever a victim of a crime or a disaster?: No Witnessed domestic violence?: No Has patient been affected by domestic violence as an adult?: No  Child/Adolescent Assessment:     CCA Substance Use Alcohol/Drug Use: Alcohol / Drug Use Pain Medications: see mar Prescriptions: see mar Over the Counter: see mar History of alcohol / drug use?: No history of alcohol / drug abuse  ASAM's:  Six Dimensions of Multidimensional Assessment  Dimension 1:  Acute Intoxication and/or Withdrawal Potential:      Dimension 2:  Biomedical Conditions and Complications:      Dimension 3:  Emotional, Behavioral, or Cognitive Conditions and Complications:     Dimension 4:  Readiness to Change:     Dimension 5:  Relapse, Continued use, or Continued Problem Potential:     Dimension 6:  Recovery/Living Environment:     ASAM Severity Score:    ASAM Recommended Level of Treatment:     Substance use Disorder (SUD)    Recommendations for Services/Supports/Treatments:    DSM5 Diagnoses: Patient Active Problem List   Diagnosis Date Noted   Intellectual disability 11/09/2024   Anxiety 11/09/2024   Behavior concern in adult 11/09/2024   Porokeratosis 04/05/2019   Plantar flexed metatarsal bone of right foot 04/05/2019   BPV (benign positional vertigo) 05/12/2016   History of thyroid  cancer 10/07/2014    Patient Centered Plan: Patient is on the following Treatment Plan(s):  Impulse Control   Referrals to Alternative Service(s): Referred to Alternative Service(s):   Place:   Date:   Time:    Referred to Alternative  Service(s):   Place:   Date:   Time:    Referred to Alternative Service(s):   Place:   Date:   Time:    Referred to Alternative Service(s):   Place:   Date:   Time:      @[facility] @  [employer], LCAS-A

## 2024-11-10 NOTE — ED Notes (Signed)
 Pt escorted to interview room.

## 2024-11-10 NOTE — ED Notes (Signed)
 Pt up out of bed knocking on door and attempting to open door into [facility] common area. Per previous shift pt known to be disruptive attempting to go into other pts room and being unable to be redirected requiring being placed in secured unit. This RN to door and asked pt to go back to bed as banging sound is disruptive to other pts sleeping. Pt back to bed and appears to be sleeping with RR even and unlabored.

## 2024-11-10 NOTE — ED Notes (Signed)
 Pt escorted back to [facility] room with RN and security x2.

## 2024-11-10 NOTE — ED Notes (Signed)
 Meal tray provided. Tray checked for any potential hazards. Pt denies no additional needs at this time.

## 2024-11-10 NOTE — ED Notes (Signed)
 Pt safe for discharge at this time. Discharge instructions reviewed with pt and sister, Ivan Winters, and able to verbalized understanding. Pt denies any questions. Pt ambulatory out of department with steady gait dressed in appropriate clothing to be driven home with sister.

## 2024-11-10 NOTE — ED Notes (Signed)
 This RN spoke with Apolinar, pts sister and emergency contact. Apolinar plans to pick the patient up in the morning if weather permits. Apolinar states that she is about 2 hours away and is the full time caregiver for mother who is demented. She has spoken with the psych team and was given update on pt.

## 2024-11-10 NOTE — ED Notes (Signed)
 Obeservation until safe discharge is completed
